# Patient Record
Sex: Female | Born: 1970 | Race: White | Hispanic: No | Smoking: Former smoker
Health system: Southern US, Community
[De-identification: ages and names within clinical notes are randomized; demographics above are authoritative.]

## PROBLEM LIST (undated history)

## (undated) DIAGNOSIS — R079 Chest pain, unspecified: Secondary | ICD-10-CM

## (undated) DIAGNOSIS — R112 Nausea with vomiting, unspecified: Secondary | ICD-10-CM

## (undated) DIAGNOSIS — M5412 Radiculopathy, cervical region: Secondary | ICD-10-CM

## (undated) DIAGNOSIS — T4145XA Adverse effect of unspecified anesthetic, initial encounter: Secondary | ICD-10-CM

## (undated) DIAGNOSIS — T8859XA Other complications of anesthesia, initial encounter: Secondary | ICD-10-CM

## (undated) DIAGNOSIS — I639 Cerebral infarction, unspecified: Secondary | ICD-10-CM

## (undated) DIAGNOSIS — F329 Major depressive disorder, single episode, unspecified: Secondary | ICD-10-CM

## (undated) DIAGNOSIS — I10 Essential (primary) hypertension: Secondary | ICD-10-CM

## (undated) DIAGNOSIS — F419 Anxiety disorder, unspecified: Secondary | ICD-10-CM

## (undated) DIAGNOSIS — F32A Depression, unspecified: Secondary | ICD-10-CM

## (undated) DIAGNOSIS — Z9889 Other specified postprocedural states: Secondary | ICD-10-CM

## (undated) DIAGNOSIS — G43909 Migraine, unspecified, not intractable, without status migrainosus: Secondary | ICD-10-CM

## (undated) DIAGNOSIS — K219 Gastro-esophageal reflux disease without esophagitis: Secondary | ICD-10-CM

## (undated) DIAGNOSIS — M797 Fibromyalgia: Secondary | ICD-10-CM

## (undated) DIAGNOSIS — K8020 Calculus of gallbladder without cholecystitis without obstruction: Secondary | ICD-10-CM

## (undated) DIAGNOSIS — D649 Anemia, unspecified: Secondary | ICD-10-CM

## (undated) HISTORY — DX: Radiculopathy, cervical region: M54.12

## (undated) HISTORY — DX: Cerebral infarction, unspecified: I63.9

## (undated) HISTORY — DX: Essential (primary) hypertension: I10

## (undated) HISTORY — PX: CLEFT PALATE REPAIR: SUR1165

## (undated) HISTORY — PX: CLEFT LIP REPAIR: SUR1164

---

## 1996-11-13 HISTORY — PX: TUBAL LIGATION: SHX77

## 1998-09-29 ENCOUNTER — Encounter: Payer: Self-pay | Admitting: Emergency Medicine

## 1998-09-29 ENCOUNTER — Emergency Department (HOSPITAL_COMMUNITY): Admission: EM | Admit: 1998-09-29 | Discharge: 1998-09-29 | Payer: Self-pay | Admitting: Emergency Medicine

## 1998-09-30 ENCOUNTER — Emergency Department (HOSPITAL_COMMUNITY): Admission: EM | Admit: 1998-09-30 | Discharge: 1998-09-30 | Payer: Self-pay | Admitting: Emergency Medicine

## 2003-05-19 ENCOUNTER — Other Ambulatory Visit: Admission: RE | Admit: 2003-05-19 | Discharge: 2003-05-19 | Payer: Self-pay | Admitting: Obstetrics and Gynecology

## 2003-07-21 ENCOUNTER — Emergency Department (HOSPITAL_COMMUNITY): Admission: EM | Admit: 2003-07-21 | Discharge: 2003-07-22 | Payer: Self-pay | Admitting: Emergency Medicine

## 2004-12-20 ENCOUNTER — Emergency Department: Payer: Self-pay | Admitting: Emergency Medicine

## 2005-11-13 DIAGNOSIS — I639 Cerebral infarction, unspecified: Secondary | ICD-10-CM

## 2005-11-13 HISTORY — DX: Cerebral infarction, unspecified: I63.9

## 2005-11-17 ENCOUNTER — Encounter: Admission: RE | Admit: 2005-11-17 | Discharge: 2005-11-17 | Payer: Self-pay | Admitting: Gastroenterology

## 2006-06-04 ENCOUNTER — Emergency Department (HOSPITAL_COMMUNITY): Admission: EM | Admit: 2006-06-04 | Discharge: 2006-06-04 | Payer: Self-pay | Admitting: Emergency Medicine

## 2006-06-06 ENCOUNTER — Observation Stay (HOSPITAL_COMMUNITY): Admission: EM | Admit: 2006-06-06 | Discharge: 2006-06-08 | Payer: Self-pay | Admitting: *Deleted

## 2006-06-06 ENCOUNTER — Ambulatory Visit: Payer: Self-pay | Admitting: Family Medicine

## 2006-06-07 ENCOUNTER — Encounter (INDEPENDENT_AMBULATORY_CARE_PROVIDER_SITE_OTHER): Payer: Self-pay | Admitting: *Deleted

## 2006-06-11 ENCOUNTER — Other Ambulatory Visit (HOSPITAL_COMMUNITY): Admission: RE | Admit: 2006-06-11 | Discharge: 2006-09-09 | Payer: Self-pay | Admitting: Psychiatry

## 2006-06-12 ENCOUNTER — Ambulatory Visit: Payer: Self-pay | Admitting: Psychiatry

## 2006-06-14 ENCOUNTER — Encounter: Admission: RE | Admit: 2006-06-14 | Discharge: 2006-09-12 | Payer: Self-pay | Admitting: Family Medicine

## 2006-06-18 ENCOUNTER — Ambulatory Visit: Payer: Self-pay | Admitting: Sports Medicine

## 2006-06-26 ENCOUNTER — Ambulatory Visit: Payer: Self-pay | Admitting: Sports Medicine

## 2006-06-29 ENCOUNTER — Ambulatory Visit: Payer: Self-pay | Admitting: Internal Medicine

## 2007-01-10 DIAGNOSIS — G43109 Migraine with aura, not intractable, without status migrainosus: Secondary | ICD-10-CM | POA: Insufficient documentation

## 2007-01-10 DIAGNOSIS — G43909 Migraine, unspecified, not intractable, without status migrainosus: Secondary | ICD-10-CM

## 2007-06-04 ENCOUNTER — Other Ambulatory Visit: Admission: RE | Admit: 2007-06-04 | Discharge: 2007-06-04 | Payer: Self-pay | Admitting: Family Medicine

## 2008-03-08 ENCOUNTER — Emergency Department: Payer: Self-pay | Admitting: Emergency Medicine

## 2008-08-01 ENCOUNTER — Emergency Department: Payer: Self-pay | Admitting: Unknown Physician Specialty

## 2008-10-02 ENCOUNTER — Other Ambulatory Visit: Admission: RE | Admit: 2008-10-02 | Discharge: 2008-10-02 | Payer: Self-pay | Admitting: Family Medicine

## 2008-10-05 ENCOUNTER — Encounter: Admission: RE | Admit: 2008-10-05 | Discharge: 2008-10-05 | Payer: Self-pay | Admitting: Family Medicine

## 2008-11-13 HISTORY — PX: ROTATOR CUFF REPAIR: SHX139

## 2009-08-22 ENCOUNTER — Encounter: Admission: RE | Admit: 2009-08-22 | Discharge: 2009-08-22 | Payer: Self-pay | Admitting: Neurology

## 2011-03-31 NOTE — Discharge Summary (Signed)
[name], [name]                ACCOUNT NO.:  [id number]   MEDICAL RECORD NO.:  [id number]          PATIENT TYPE:  OBV   LOCATION:  3015                         FACILITY:  [facility]   PHYSICIAN:  Leighton Roach McDiarmid, M.D.DATE OF BIRTH:  [birth year]   DATE OF ADMISSION:  DATE OF DISCHARGE:  06/08/2006                                 DISCHARGE SUMMARY   DISCHARGE DIAGNOSES:  1.  Conversion disorder.  2.  Migraine headaches.  3.  Depression.  4.  Anxiety.   DISCHARGE MEDICATIONS:  1.  Effexor XR 37.5 mg daily x7 days, then Effexor XR 75 mg daily.  2.  Percocet 325/5 one to two tabs q.4-6 hours p.r.n. headache.   DISCHARGE INSTRUCTIONS:  The patient was to followup at [facility]  for an intake appointment at 2 p.m. on June 09, 2006, and the patient was to  begin incentive outpatient program at [facility] on Monday, June 11, 2006.  The patient is to followup with her headache doctor, Dr. Neale Burly, as  previously scheduled, and the patient is to followup with Dr. Luz Brazen at  [facility] on June 18, 2006 at 3:30 p.m.   PROCEDURES:  1.  MRI of brain.  2.  Carotid Dopplers.  3.  Two D echocardiogram.   CONSULTS:  1.  Neurology.  2.  Physical therapy.  3.  Occupational therapy.  4.  Speech therapy.   HOSPITAL COURSE BY PROBLEMS:  1.  Conversion disorder:  Ms. [name] is a 40 year old black female who      presented to the [facility] emergency department on July 22nd with      numbness and left facial droop accompanied by a severe headache.  The      patient also complained of difficulty speaking.  At that time, she left      [facility] against medical advice secondary to claustrophobia      and fear of the MRI machine.  Her headache was treated at the [facility] ED, and after her headache lessened, per the patient and her      mother, her symptoms resolved completely.  They returned the following      day when her headache  worsened.  She saw her headache doctor on June 06, 2006, the date of admission, who noticed the decreased sensation in her      left face and weakness in her left upper and lower extremities, as well      as her dysarthria, and recommended that she come to the [facility]      emergency department to have an MRI done with stroke workup.  She      presented to the [facility] ED shortly after that complaining of the      same left facial droop, dysarthria and left upper and lower extremity      numbness and weakness.  The patient was started on aspirin 325 mg daily.      A complete stroke workup was done, with all  negative including negative      carotid Dopplers, negative 2D echocardiogram.  A sed rate was within      normal limits to rule out vasculitis.  A fasting lipid panel was within      normal limits.  The patient had an inconsistent exam with varying      degrees of weakness on the left side.  Her speech was slow, but      articulate and coherent.  Neuro was consulted, and determined that her      symptoms were nonorganic and consistent with a conversion disorder.  The      patient was extremely depressed and anxious.  The loss of the patient's      father at patient's current age of heart disease, the loss of her      grandmother and best friend 1 year ago, marital problems and trouble at      work are all current stressors.  The patient is to attend intensive      outpatient therapy at [facility], and the patient was started on      Effexor.  2.  Depression:  The patient is extremely depressed.  She complained of      anhedonia, depressed mood, insomnia and decreased appetite, guilt,      decreased concentration, hopelessness, and tearfulness.  She denied      suicidal ideation and homicidal ideation.  She is referred to [facility] for the intensive outpatient program, and started on Effexor.  3.  Migraines:  The patient sees Dr. Neale Burly for migraines.   Nothing has      every really worked well to control her headaches.  She is discharged      from the hospital on Percocet, which she says has worked the best of      anything, but will need an improved migraine regimen as an outpatient.     ______________________________  Levander Campion, M.D.    ______________________________  Leighton Roach McDiarmid, M.D.    JH/MEDQ  D:  06/11/2006  T:  06/12/2006  Job:  [id number]   cc:   Etta Grandchild, M.D.  Levander Campion, M.D.  Behavioral Health  Dr. Neale Burly  [facility] Clinic

## 2011-03-31 NOTE — H&P (Signed)
[name], [name]                ACCOUNT NO.:  [id number]   MEDICAL RECORD NO.:  [id number]          PATIENT TYPE:  INP   LOCATION:  3015                         FACILITY:  [facility]   PHYSICIAN:  Angeline Slim, M.D.  DATE OF BIRTH:  Apr 10, 1971   DATE OF ADMISSION:  06/06/2006  DATE OF DISCHARGE:                                HISTORY & PHYSICAL   CHIEF COMPLAINT:  Left-sided weakness, facial droop, numbness.   HISTORY OF PRESENT ILLNESS:  A 40 year old white female presented to [facility] Emergency Department on June 03, 2006 with numbness and left facial  droop accompanied by severe headache. She presented here today with the same  symptoms.  The patient has a history of severe migraines treated by the  headache clinic with very little success.  She left [facility] AMA  on Monday secondary to claustrophobia and fear of the MRI machine.  She saw  her headache doctor today who noted the decreased sensation in her left face  and mild weakness in the left upper and lower extremities as well as her  dysarthria and recommended that she come back to the emergency department  and have an MRI done with stroke work-up.  She is still having headaches as  well as dysarthria since Monday.  Her facial droop and weakness/numbness  have improved since Monday but are still present.   REVIEW OF SYSTEMS:  Denies chest pain.  Denies shortness of breath.  Denies  nausea and vomiting, diarrhea, constipation.  Denies rash.  Denies fever.  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  Denies dysuria.   PAST MEDICAL HISTORY:  1.  Migraine.  2.  Depression.  3.  Congenital cleft palate, status post repair.  4.  Sinus problems.   FAMILY HISTORY:  Mother is alive and healthy.  Father died at 28 years of an  MI.  Uncle also died at a young age or an MI.  Grandmothers x2 had CVAs.  They were both older.   SOCIAL HISTORY:  The patient denies alcohol, tobacco  or drug use.  The  patient was lives with her husband and two boys, one 15 years and one grade  school age.  The patient works at __________ [employer] hospital.   MEDICATIONS:  Zoloft which was discontinued after her stroke on Monday by  the patient.   ALLERGIES:  PENICILLIN, ERYTHROMYCIN.   PHYSICAL EXAMINATION:  VITAL SIGNS:  Stable noted.  GENERAL:  In no acute distress.  PSYCHIATRY:  Alert and oriented x3.  Cognition intact.  Flat affect.  Normal  mood.  NEUROLOGIC:  Normal gait.  Left upper extremity 4+/5 strength.  Left lower  extremity 4+/5 strength.  Total left facial droop.  The patient is able to  smile but there is asymmetric look to it.  The patient has dysarthric  speech.  Balance is intact.  Cognition is intact.  She has mild difficulty  with the right-sided heel to the left-sided shin test but normal with left-  sided heel to right shin test.  The patient passed  the bedside swallow  evaluation.  PULMONARY:  Clear to auscultation bilaterally.  CARDIOVASCULAR:  Regular rate and rhythm.  No murmurs, rubs, or gallops.  2+  dorsalis pedis. No carotid bruits.  ABDOMEN:  Soft, nontender.  Normoactive bowel sounds.  EXTREMITIES:  No edema.  SKIN: No rash.  BACK:  Nontender.   LABORATORY DATA:  Hemoglobin 13.9, hematocrit 41.1, platelets 234, white  count 5.3.  ANC 2.9.  CK 48, MB 0.6, troponin 0.02.  Urinalysis normal.  INR  0.9, PTT 32.  BNP less than 30.  ISTAT normal.  Head MRI showed possible  small aneurysms, acute infarct.  EKG showed sinus bradycardia but otherwise  normal.   ASSESSMENT/PLAN:  A 40 year old white female with acute infarct causing left-  sided facial droop and dysarthria with left-sided weakness.  1.  Stroke:  Will start aspirin 325 mg p.o. daily.  Will keep her on      telemetry overnight to rule out paroxysmal atrial fibrillation although      EKG looks good today.  Will check a 2-D echo.  Will consider TEE if this      is negative.  Patent  foramen ovale is a possibility but no evidence of      deep venous thrombosis on exam.  We will get sed rate and CRP to help      rule out vasculitis or other inflammatory causes.  We will check ANA for      possible lupus.  Will check HIV and urine drug screen although the      patient seems to be at low risk given her social history.  She will need      attempted PT/OT, speech therapy.  We will ask their opinion on inpatient      versus outpatient rehab as well.  We will also check homocystine and      hypercoagulability panel.  We will also speak to the librarian about      getting access to review articles __________ that cover the subject of      young people with strokes and could give further insight into further      work-up.  Also check fasting lipid panel and carotid Dopplers.   1.  Migraines:  The patient will get Percocet p.r.n. here in the hospital.   1.  Disability:  We will get Social work consult to help research this and      answer the patient's questions regarding short-term disability.  She      will follow up with me, Angeline Slim, MD, at the [facility].      Angeline Slim, M.D.     AL/MEDQ  D:  06/07/2006  T:  06/07/2006  Job:  [id number]

## 2011-03-31 NOTE — Consult Note (Signed)
NAMEEmalynn, [name]                ACCOUNT NO.:  [id number]   MEDICAL RECORD NO.:  [id number]          PATIENT TYPE:  EMS   LOCATION:  ED                           FACILITY:  [facility]   PHYSICIAN:  Pramod P. Pearlean Brownie, MD    DATE OF BIRTH:  04-20-1971   DATE OF CONSULTATION:  DATE OF DISCHARGE:                                   CONSULTATION   REASON FOR CONSULT:  Code stroke.   HISTORY OF PRESENT ILLNESS:  [name] is a 40 year old Caucasian lady  who started having a headache last night.  The headache got worse today and  she has had constant severe headache involving her vertex with some nausea  and photophobia.  At about 1:30 today she noticed sudden onset of some left  facial weakness and droop, as well as some numbness.  She also had some  blurred vision for a few minutes which resolved quickly.  On arrival at  [facility] emergency room she was noted as having some facial weakness and  subjective numbness, and hence code stroke was called.  CT scan of the head  was unremarkable.  Patient still has a persistent headache.  She has taken a  __________ tablet last night as well as today with only minimum relief.  She  has a long history of migraine headaches for which she sees Dr. Neale Burly at  the [facility].  She was placed on Topamax 75 mg a day for  headache prophylaxis, but she continued to have headaches, and hence she  stopped it a few months ago.  She has noted increasing headache frequency to  once per week now.  Over-the-counter medications do not seem to help much.  She has also tried Flexeril which does not help.  She has had some blurred  vision with some of her previous headaches, but denies any facial weakness  or speech difficulties.  She denies any double vision at the present time.  She has no known history of stroke, TIA, seizures or other neurological  problems.  She does not have any significant vascular risk factors in the  form of smoking,  hypertension, diabetes, hyperlipidemia or heart disease.   HOME MEDICATIONS:  Not listed.   MEDICATION ALLERGIES:  1.  PENICILLIN.  2.  AMOXICILLIN.  3.  ERYTHROMYCIN.   SOCIAL HISTORY:  The patient is married, lives with her husband.  She quit  smoking more than 10 years ago.  She does not drink alcohol, denies doing  drugs.  She has two boys.   REVIEW OF SYSTEMS:  Positive for headache, blurred vision, numbness, and  weakness.   PHYSICAL EXAMINATION:  Young Caucasian lady who is not in distress, and  afebrile.  Pulse rate is 87 per minute and regular.  Respiratory rate 20 per  minute.  Blood pressure 128/85.  Sats 97% on room air.  HEAD:  Nontraumatic.  Neck is supple without bruits.  ENT exam unremarkable.  CARDIAC EXAM:  No murmur or gallop.  LUNGS:  Clear to auscultation.  ABDOMEN:  Soft and nontender.  NEUROLOGICAL EXAM:  Patient is pleasant, awake, alert, cooperative with no  aphasia, apraxia or dysarthria.  Eyes movements are full range without  nystagmus.  She has some hypotropia of the left eye.  There is some  restriction of up-gaze, but there is no objective diplopia when she does so.  There is mild facial asymmetry with weakness of the right nasolabial fold  when she smiles.  She has good strength on the forehead muscles and eye  closure.  She is unable to whistle and blow her cheeks, but I doubt her  cooperation.  She appears anxious.  Palatal movements normal, tongue is  midline.  Motor system exam reveals no upper extremity drift, symmetric  strength, tone, reflexes, coordination and sensation.  Gait was not tested.  Patient complains of subjective numbness in the left side of the face and  body, but she splits the midline on her forehead as well as face.   DATA REVIEW:  Comprehensive metabolic panel is normal.  Urine drug screen is  negative.  UA is normal.  WBC count is normal.  CT of the head, noncontrast  study, is normal.   IMPRESSION:  A 40 year old lady  with sudden onset of headache with left  sided body paresthesia, then right lower facial weakness.  Neurological exam  is not commensurate with a stroke in a single vascular distribution.  Perhaps this may represent complicated migraine with some underlying anxiety  with embellishment of her symptoms.  She does not have typical vascular risk  factors.   PLAN:  I would recommend treatment of the head with Reglan 20 mg with  Toradol 30 mg IV.  If this is not effective, may use Dilaudid.  I will  advise her to go back on her Topamax 75 mg a day for migraine prevention.  MRI scan of the brain.  If it is negative patient can be discharged home on  Topamax and advised to follow up with her neurologist, Dr. Neale Burly, in the  future as needed.  If the MRI does show a stroke, we will admit her for  further workup for stroke in the young.  I will be happy to follow her in  the future if needed.  Kindly call for questions.           ______________________________  Sunny Schlein. Pearlean Brownie, MD     PPS/MEDQ  D:  06/04/2006  T:  06/05/2006  Job:  [id number]

## 2011-03-31 NOTE — Consult Note (Signed)
NAMECHRISTIANNE, [name]                ACCOUNT NO.:  [id number]   MEDICAL RECORD NO.:  [id number]          PATIENT TYPE:  INP   LOCATION:  3015                         FACILITY:  [facility]   PHYSICIAN:  Genene Churn. Love, M.D.    DATE OF BIRTH:  11-29-1970   DATE OF CONSULTATION:  06/07/2006  DATE OF DISCHARGE:                                   CONSULTATION   NEUROLOGICAL CONSULTATION.   PATIENT'S ADDRESS:  [address], [place].   This is a 40 year old right-handed white married female is seen for  evaluation of possible stroke.   HISTORY OF PRESENT ILLNESS:  [name] has no known history of high  blood pressure, diabetes mellitus, heart disease, cigarette use, drug use,  or current hormone therapy.  She has a long history of migraine headaches  beginning in childhood associated with car sickness as a child and positive  family history of migraine headaches in that her mother and maternal aunts  have all had migraine headaches.  She has had a headache since Saturday June 02, 2006 and on June 04, 2006 noticed difficulty with her facial drawing.  She had delayed speech and slurred speech.  She was seen by Dr. Onnie Boer  physician associate June 06, 2006 and referred to [facility].  There she was noted to have slow slurred speech, blurred vision and left-  sided weakness and was admitted.  She has been on Zoloft and recently  increased to 100 mg per day for headaches.  She has also been on Flexeril  for her headaches as an abortive therapy ,which has been successful and has  been followed by Dr. Santiago Glad, who began Topamax in October 2006,  which has not been very effective for headaches.  She states that she has a  history of endometriosis in the past.  She has had cleft lip surgery, tubal  ligation and admitted to the hospital for birth.  SHE HAS A HISTORY OF  ALLERGIES TO ERYTHROMYCIN, PENICILLIN AND AMOXICILLIN.  She was educated  through 12+ grades.  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   EXAMINATION:  GENERAL:  Revealed a well-developed white female with a blood  pressure in the right and left arm of 120/80 and 110/80.  Heart rate was 80  and regular.  NECK:  There were no carotid or supraclavicular bruits heard.  The tympanic  membranes were clear.  The neck flexion/extension maneuvers were  unremarkable.  MENTAL STATUS:  She was alert and oriented x3, followed three-step commands.  Her cranial nerve examination revealed that she used childlike speech.  There was no evidence of an aphasia.  Cranial nerve examination visual  fields full.  Both disks were seen and flat.  The extraocular movements were  full and corneals were present.  The facial sensation was equal.  There was  no facial motor asymmetry.  Hearing was present with air conduction greater  than bone conduction.  Tongue was midline.  The uvula was midline and gags  were present.  There was an asymmetry to her face with a slight left ptosis  and an increase in the left corner of the mouth versus right possibly  related to her old cleft lip surgery.  Her motor examination with give-way  phenomenon in the left hand and arm more so than the right, but it was  present bilaterally.  She also gave-way with strength testing in her left  foot and leg and had altered sensation, a two-point discrimination in her  left hand.  Deep tendon reflexes were 1-2+.  Plantar responses were  downgoing.   LABORATORY DATA:  A CT scan of the brain on June 04, 2006 was normal.  MRI  study of the brain June 06, 2006 showed no acute infarct.  She had minimal  nonspecific white matter changes.  Intracranial MRA was unremarkable.  Her  laboratory data revealed a pH of 7378,  PCO2 43.1, bicarb 25.4, PO2 is 27,  white blood cell count 4700, hemoglobin was 12.2, hematocrit 36.5, platelet  count 201,000.  Her sed rate was 1.  Pro time  was 12.2 with an INR of 0.9  and PTT of 32.  Sodium is 139, potassium 3.7, chloride 107, CO2 content was  27, glucose 87, BUN 11.  Cholesterol 189, triglycerides 108, HDL 45, LDLs  high at 122.  Calcium was 9.  Drug screen was negative.  Pregnancy test was  negative.   IMPRESSION:  1.  Migraine headaches, 346.10.  2.  Doubt acute stroke.  3.  Non-organic examination.  4.  Depression, 311.   At this time, I agree with workup.  I do not think this person has had an  acute stroke.  This may be migraine TIA with some embellishment and  possible underlying depression.  Plan at this time is to review her studies.           ______________________________  Genene Churn. Sandria Manly, M.D.     JML/MEDQ  D:  06/07/2006  T:  06/07/2006  Job:  [id number]   cc:   Santiago Glad  Fax: [phone]

## 2011-09-11 ENCOUNTER — Other Ambulatory Visit (HOSPITAL_COMMUNITY)
Admission: RE | Admit: 2011-09-11 | Discharge: 2011-09-11 | Disposition: A | Discharge status: Home / Self Care | Payer: Self-pay | Source: Ambulatory Visit | Attending: Family Medicine | Admitting: Family Medicine

## 2011-09-11 ENCOUNTER — Other Ambulatory Visit: Payer: Self-pay | Admitting: Physician Assistant

## 2011-09-11 DIAGNOSIS — Z01419 Encounter for gynecological examination (general) (routine) without abnormal findings: Secondary | ICD-10-CM | POA: Insufficient documentation

## 2011-09-13 ENCOUNTER — Other Ambulatory Visit (HOSPITAL_COMMUNITY): Payer: Self-pay | Admitting: Family Medicine

## 2011-09-13 DIAGNOSIS — Z1231 Encounter for screening mammogram for malignant neoplasm of breast: Secondary | ICD-10-CM

## 2011-12-04 ENCOUNTER — Ambulatory Visit: Payer: Self-pay

## 2011-12-26 ENCOUNTER — Other Ambulatory Visit: Payer: Self-pay | Admitting: Family Medicine

## 2011-12-26 DIAGNOSIS — Z1231 Encounter for screening mammogram for malignant neoplasm of breast: Secondary | ICD-10-CM

## 2012-01-01 ENCOUNTER — Ambulatory Visit: Payer: Self-pay

## 2012-12-11 ENCOUNTER — Emergency Department (HOSPITAL_COMMUNITY): Payer: Self-pay

## 2012-12-11 ENCOUNTER — Observation Stay (HOSPITAL_COMMUNITY): Payer: Self-pay

## 2012-12-11 ENCOUNTER — Observation Stay (HOSPITAL_COMMUNITY)
Admission: EM | Admit: 2012-12-11 | Discharge: 2012-12-12 | Disposition: A | Discharge status: Home / Self Care | Payer: 59 | Attending: Cardiology | Admitting: Cardiology

## 2012-12-11 ENCOUNTER — Encounter (HOSPITAL_COMMUNITY): Payer: Self-pay

## 2012-12-11 DIAGNOSIS — R079 Chest pain, unspecified: Secondary | ICD-10-CM

## 2012-12-11 DIAGNOSIS — F17200 Nicotine dependence, unspecified, uncomplicated: Secondary | ICD-10-CM | POA: Insufficient documentation

## 2012-12-11 DIAGNOSIS — Z720 Tobacco use: Secondary | ICD-10-CM | POA: Diagnosis present

## 2012-12-11 DIAGNOSIS — F411 Generalized anxiety disorder: Secondary | ICD-10-CM | POA: Insufficient documentation

## 2012-12-11 DIAGNOSIS — Z23 Encounter for immunization: Secondary | ICD-10-CM | POA: Insufficient documentation

## 2012-12-11 DIAGNOSIS — K8020 Calculus of gallbladder without cholecystitis without obstruction: Secondary | ICD-10-CM | POA: Insufficient documentation

## 2012-12-11 DIAGNOSIS — F329 Major depressive disorder, single episode, unspecified: Secondary | ICD-10-CM | POA: Insufficient documentation

## 2012-12-11 DIAGNOSIS — F3289 Other specified depressive episodes: Secondary | ICD-10-CM | POA: Insufficient documentation

## 2012-12-11 DIAGNOSIS — Z8249 Family history of ischemic heart disease and other diseases of the circulatory system: Secondary | ICD-10-CM | POA: Insufficient documentation

## 2012-12-11 DIAGNOSIS — R0789 Other chest pain: Secondary | ICD-10-CM | POA: Insufficient documentation

## 2012-12-11 DIAGNOSIS — R42 Dizziness and giddiness: Secondary | ICD-10-CM | POA: Insufficient documentation

## 2012-12-11 DIAGNOSIS — R209 Unspecified disturbances of skin sensation: Secondary | ICD-10-CM | POA: Insufficient documentation

## 2012-12-11 DIAGNOSIS — R0602 Shortness of breath: Secondary | ICD-10-CM | POA: Insufficient documentation

## 2012-12-11 DIAGNOSIS — R1013 Epigastric pain: Principal | ICD-10-CM | POA: Insufficient documentation

## 2012-12-11 DIAGNOSIS — G43909 Migraine, unspecified, not intractable, without status migrainosus: Secondary | ICD-10-CM | POA: Insufficient documentation

## 2012-12-11 HISTORY — DX: Nausea with vomiting, unspecified: Z98.890

## 2012-12-11 HISTORY — DX: Nausea with vomiting, unspecified: R11.2

## 2012-12-11 HISTORY — DX: Adverse effect of unspecified anesthetic, initial encounter: T41.45XA

## 2012-12-11 HISTORY — DX: Migraine, unspecified, not intractable, without status migrainosus: G43.909

## 2012-12-11 HISTORY — DX: Other complications of anesthesia, initial encounter: T88.59XA

## 2012-12-11 HISTORY — DX: Calculus of gallbladder without cholecystitis without obstruction: K80.20

## 2012-12-11 HISTORY — DX: Anxiety disorder, unspecified: F41.9

## 2012-12-11 HISTORY — DX: Chest pain, unspecified: R07.9

## 2012-12-11 HISTORY — DX: Gastro-esophageal reflux disease without esophagitis: K21.9

## 2012-12-11 HISTORY — DX: Depression, unspecified: F32.A

## 2012-12-11 HISTORY — DX: Major depressive disorder, single episode, unspecified: F32.9

## 2012-12-11 LAB — CREATININE, SERUM
GFR calc Af Amer: >90 mL/min (ref >90)
GFR calc non Af Amer: >90 mL/min (ref >90)

## 2012-12-11 LAB — CBC
HCT: 38.2 % (ref 36.0–46.0)
HCT: 36.8 % (ref 36.0–46.0)
MCH: 29.2 pg (ref 26.0–34.0)
MCHC: 33.5 g/dL (ref 30.0–36.0)
MCV: 88 fL (ref 78.0–100.0)
Platelets: 201 10*3/uL (ref 150–400)
RBC: 4.38 MIL/uL (ref 3.87–5.11)
RBC: 4.18 MIL/uL (ref 3.87–5.11)
WBC: 6 10*3/uL (ref 4.0–10.5)
WBC: 5.7 10*3/uL (ref 4.0–10.5)

## 2012-12-11 LAB — LIPASE, BLOOD: Lipase: 27 U/L (ref 11–59)

## 2012-12-11 LAB — POCT I-STAT TROPONIN I: Troponin i, poc: 0 ng/mL (ref 0.00–0.08)

## 2012-12-11 LAB — COMPREHENSIVE METABOLIC PANEL
ALT: 9 U/L (ref 0–35)
Albumin: 4 g/dL (ref 3.5–5.2)
Chloride: 105 mEq/L (ref 96–112)
Creatinine, Ser: 0.53 mg/dL (ref 0.50–1.10)
Glucose, Bld: 88 mg/dL (ref 70–99)
Total Protein: 6.7 g/dL (ref 6.0–8.3)

## 2012-12-11 LAB — PROTIME-INR: INR: 0.98 (ref 0.00–1.49)

## 2012-12-11 MED ORDER — DIPHENHYDRAMINE HCL 25 MG PO CAPS: 50.0000 mg | ORAL_CAPSULE | Freq: Once | ORAL | Status: DC

## 2012-12-11 MED ORDER — ALPRAZOLAM 0.25 MG PO TABS
0.2500 mg | ORAL_TABLET | Freq: Two times a day (BID) | ORAL | Status: DC | PRN
  Administered 2012-12-11 – 2012-12-12 (×2): 0.25 mg via ORAL
  Filled 2012-12-11 (×2): qty 1

## 2012-12-11 MED ORDER — ENOXAPARIN SODIUM 40 MG/0.4ML ~~LOC~~ SOLN
40.0000 mg | SUBCUTANEOUS | Status: DC
  Administered 2012-12-11: 40 mg via SUBCUTANEOUS
  Filled 2012-12-11 (×2): qty 0.4

## 2012-12-11 MED ORDER — ACETAMINOPHEN 325 MG PO TABS
650.0000 mg | ORAL_TABLET | ORAL | Status: DC | PRN
  Administered 2012-12-12 (×2): 650 mg via ORAL
  Filled 2012-12-11 (×2): qty 2

## 2012-12-11 MED ORDER — ASPIRIN 81 MG PO CHEW
324.0000 mg | CHEWABLE_TABLET | Freq: Once | ORAL | Status: AC
  Administered 2012-12-11: 324 mg via ORAL
  Filled 2012-12-11: qty 4

## 2012-12-11 MED ORDER — PNEUMOCOCCAL VAC POLYVALENT 25 MCG/0.5ML IJ INJ
0.5000 mL | INJECTION | INTRAMUSCULAR | Status: AC
  Administered 2012-12-12: 0.5 mL via INTRAMUSCULAR
  Filled 2012-12-11: qty 0.5

## 2012-12-11 MED ORDER — ONDANSETRON HCL 4 MG/2ML IJ SOLN
4.0000 mg | Freq: Once | INTRAMUSCULAR | Status: AC
  Administered 2012-12-11: 4 mg via INTRAVENOUS
  Filled 2012-12-11: qty 2

## 2012-12-11 MED ORDER — METOPROLOL TARTRATE 25 MG PO TABS
25.0000 mg | ORAL_TABLET | Freq: Two times a day (BID) | ORAL | Status: DC
  Administered 2012-12-11 – 2012-12-12 (×2): 25 mg via ORAL
  Filled 2012-12-11 (×3): qty 1

## 2012-12-11 MED ORDER — SODIUM CHLORIDE 0.9 % IV SOLN: 250.0000 mL | INTRAVENOUS | Status: DC | PRN

## 2012-12-11 MED ORDER — PREDNISONE 20 MG PO TABS: 50.0000 mg | ORAL_TABLET | Freq: Four times a day (QID) | ORAL | Status: DC

## 2012-12-11 MED ORDER — INFLUENZA VIRUS VACC SPLIT PF IM SUSP
0.5000 mL | INTRAMUSCULAR | Status: DC
  Filled 2012-12-11: qty 0.5

## 2012-12-11 MED ORDER — SODIUM CHLORIDE 0.9 % IJ SOLN: 3.0000 mL | INTRAMUSCULAR | Status: DC | PRN

## 2012-12-11 MED ORDER — NITROGLYCERIN 0.4 MG SL SUBL
0.4000 mg | SUBLINGUAL_TABLET | SUBLINGUAL | Status: DC | PRN
  Administered 2012-12-11: 0.4 mg via SUBLINGUAL
  Filled 2012-12-11: qty 25

## 2012-12-11 MED ORDER — PANTOPRAZOLE SODIUM 40 MG PO TBEC
40.0000 mg | DELAYED_RELEASE_TABLET | Freq: Every day | ORAL | Status: DC
  Administered 2012-12-12: 40 mg via ORAL
  Filled 2012-12-11: qty 1

## 2012-12-11 MED ORDER — SODIUM CHLORIDE 0.9 % IJ SOLN
3.0000 mL | Freq: Two times a day (BID) | INTRAMUSCULAR | Status: DC
  Administered 2012-12-11 – 2012-12-12 (×2): 3 mL via INTRAVENOUS

## 2012-12-11 MED ORDER — ONDANSETRON HCL 4 MG/2ML IJ SOLN: 4.0000 mg | Freq: Four times a day (QID) | INTRAMUSCULAR | Status: DC | PRN

## 2012-12-11 MED ORDER — ZOLPIDEM TARTRATE 5 MG PO TABS: 5.0000 mg | ORAL_TABLET | Freq: Every evening | ORAL | Status: DC | PRN

## 2012-12-11 MED ORDER — NITROGLYCERIN 0.4 MG SL SUBL: 0.4000 mg | SUBLINGUAL_TABLET | SUBLINGUAL | Status: DC | PRN

## 2012-12-11 MED ORDER — SODIUM CHLORIDE 0.9 % IV SOLN
1000.0000 mL | INTRAVENOUS | Status: DC
  Administered 2012-12-11: 1000 mL via INTRAVENOUS

## 2012-12-11 MED ORDER — ASPIRIN EC 81 MG PO TBEC
81.0000 mg | DELAYED_RELEASE_TABLET | Freq: Every day | ORAL | Status: DC
  Administered 2012-12-12: 81 mg via ORAL
  Filled 2012-12-11: qty 1

## 2012-12-11 NOTE — ED Notes (Signed)
Pt returned from ultrasound

## 2012-12-11 NOTE — ED Notes (Signed)
Pt walked to bathroom and back with no issues. Pt reports when she got back she started to have some CP, lasted approx 4 mins and is now gone. Pt reports her left arm tingling came back a few times.

## 2012-12-11 NOTE — ED Notes (Signed)
Pt reports she wants to leave. Pt told if she did she would have to leave AMA. Pt explained benefits of staying. Pt agreed to stay a little be longer.

## 2012-12-11 NOTE — ED Notes (Signed)
Pt states she has been having epigastric pain off and on for about a week now and nauseated. Denies any vomiting or radiation of the pain.

## 2012-12-11 NOTE — ED Notes (Signed)
Cardiologist PA at bedside 

## 2012-12-11 NOTE — ED Notes (Signed)
Pt back from radiology. Pt ambulated to restroom with no issues.

## 2012-12-11 NOTE — ED Notes (Signed)
Pt denies CP and left arm tingling.

## 2012-12-11 NOTE — ED Provider Notes (Signed)
History    CSN: [id number] Arrival date & time 12/11/12  1106 First MD Initiated Contact with Patient 12/11/12 1123      Chief Complaint  Patient presents with  . Abdominal Pain/chest pain    The history is provided by the patient.  Pt started having pain in her upper abdomen and lower chest.  That started about a week ago.  The episodes come and go.  They last minutes at a time.  The pain is pinching and pressure.  She feels it radiate to her back area and gets numbness in her left arm.  Associated with the symptoms have been a lot of fatigue and some nausea.  She also has had some chills. No change with eating but she does feel a little worse when she is walking and better when she is resting. No history of heart or lung problems although she has not seen an MD in a while.  History reviewed. No pertinent past medical history.  Past Surgical History  Procedure Date  . Rotator cuff repair     left  . Tubal ligation   . Cleft lip repair   . Cleft palate repair   No abdominal surgery.  Famhx:  Father with early heart disease in his 75s, deceased  History  Substance Use Topics  . Smoking status: Current Every Day Smoker  . Smokeless tobacco: Not on file  . Alcohol Use: No    OB History    Grav Para Term Preterm Abortions TAB SAB Ect Mult Living                  Review of Systems  All other systems reviewed and are negative.    Allergies  Amoxicillin; Iohexol; and Erythromycin  Home Medications  No current outpatient prescriptions on file.  BP 126/57  Pulse 89  Temp 97.9 F (36.6 C) (Oral)  Resp 20  Ht 5\' 1"  (1.549 m)  Wt 140 lb (63.504 kg)  BMI 26.45 kg/m2  SpO2 100%  LMP 10/30/2012  Physical Exam  Nursing note and vitals reviewed. Constitutional: She appears well-developed and well-nourished. No distress.  HENT:  Head: Normocephalic and atraumatic.  Right Ear: External ear normal.  Left Ear: External ear normal.  Eyes: Conjunctivae normal are  normal. Right eye exhibits no discharge. Left eye exhibits no discharge. No scleral icterus.  Neck: Neck supple. No tracheal deviation present.  Cardiovascular: Normal rate, regular rhythm and intact distal pulses.   Pulmonary/Chest: Effort normal and breath sounds normal. No stridor. No respiratory distress. She has no wheezes. She has no rales.  Abdominal: Soft. Bowel sounds are normal. She exhibits no distension, no abdominal bruit and no pulsatile midline mass. There is tenderness in the epigastric area. There is no rebound and no guarding. No hernia.       Very mild in epigastrum  Musculoskeletal: She exhibits no edema and no tenderness.  Neurological: She is alert. She has normal strength. No sensory deficit. Cranial nerve deficit:  no gross defecits noted. She exhibits normal muscle tone. She displays no seizure activity. Coordination normal.  Skin: Skin is warm and dry. No rash noted.  Psychiatric: She has a normal mood and affect.    ED Course  Procedures (including critical care time) EKG  Rate: 85  Rhythm: normal sinus rhythm  QRS Axis: normal  Intervals: normal  ST/T Wave abnormalities: normal  Conduction Disutrbances:none  Narrative Interpretation:   Old EKG Reviewed: none available 2:13 PM patient states her pain  is resolved now. She feels better after getting nitroglycerin.   Labs Reviewed  CBC  COMPREHENSIVE METABOLIC PANEL  PROTIME-INR  APTT  LIPASE, BLOOD  POCT I-STAT TROPONIN I  POCT I-STAT TROPONIN I   Dg Chest 2 View  12/11/2012  *RADIOLOGY REPORT*  Clinical Data: Chest pain.  CHEST - 2 VIEW  Comparison: None.  Findings: Heart and mediastinal contours are within normal limits. No focal opacities or effusions.  No acute bony abnormality.  IMPRESSION: No active cardiopulmonary disease.   Original Report Authenticated By: Charlett Nose, M.D.     MDM  Patient presents emergent complaints of chest pain. She has a very strong family history and she does smoke  cigarettes. Concern that her chest pain could be associated with coronary artery disease. I will consult with the cardiologist regarding admission and stress testing versus serial enzymes here in the emergency department and close followup.        Celene Kras, MD 12/11/12 [phone]

## 2012-12-11 NOTE — ED Notes (Signed)
Attempted to give report. RN unavailable at the moment.

## 2012-12-11 NOTE — ED Notes (Signed)
Also reports pain behind her left shoulder blade

## 2012-12-11 NOTE — Consult Note (Signed)
CARDIOLOGY CONSULT NOTE   Patient ID: [name] MRN: [id number] DOB/AGE: Apr 12, 1971 42 y.o.  Admit date: 12/11/2012  Primary Physician   User Centricity, MD Primary Cardiologist   None  Reason for Consultation   Chest Pain  HPI: 42 year old female with no past history of CAD but is a current smoker presents to the ED with chest pain. She has had some chest pain on and off for the last year, but she states these episodes are isolated and occur once every month or two.   She has had intermittent chest pain for the past 3-4 days. It is a substernal pinching and pressure sensation, rated 7/10 at worst, radiates to her left shoulder blade and left arm and associated with tingling, usually lasts about 15 min, occurs many times each day, and is associated with nausea. The pain is sometimes associated with rapid heart rate, SOB, feeling lightheaded, presyncope, and diaphoresis. The pain is sometimes exertional but can occur at rest. Today at work she had some chest pain around 10 am, rated 7/10, associated with feeling lightheaded and pressyncope. On her way here she states the pain decreased to about a 2/10, and then upon arrival to the ED waiting room it returned to a 7/10. She received some ASA/NTG which relieved her pain. There is no association with meals. No chest pain tenderness but there is epigastric tenderness.   Past Medical History  Diagnosis Date  . Migraines      Past Surgical History  Procedure Date  . Rotator cuff repair 2010    left  . Tubal ligation 1998  . Cleft lip repair     Multiple  . Cleft palate repair     Multiple    Allergies  Allergen Reactions  . Amoxicillin Nausea Only  . Iohexol      Desc: Pt had throat tightness, slight swelling. Itchy eyes and itchy gums.  Dr Molli Posey spoke w/ pt.  Treating this like possible/early reaction.  Pt was counseled. Needs full premeds w/ IV contrast, txs., Onset Date: [id number]   . Erythromycin Rash    I have reviewed  the patient's current medications      . sodium chloride 1,000 mL (12/11/12 1225)   nitroGLYCERIN  Prior to Admission medications   Medication Sig Start Date End Date Taking? Authorizing Provider  aspirin 325 MG tablet Take 325 mg by mouth once.   Yes Historical Provider, MD  Aspirin-Acetaminophen-Caffeine (GOODY HEADACHE PO) Take 1 packet by mouth every 4 (four) hours as needed. For headaches   Yes Historical Provider, MD  OVER THE COUNTER MEDICATION Take 2 tablets by mouth once. Sinus pressure medication   Yes Historical Provider, MD     History   Social History  . Marital Status: Married    Spouse Name: N/A    Number of Children: N/A  . Years of Education: N/A   Occupational History  . Packer at [employer]    Social History Main Topics  . Smoking status: Current Every Day Smoker -- 0.1 packs/day for 1 years    Types: Cigarettes    Start date: 04/10/2012  . Smokeless tobacco: Never Used  . Alcohol Use: No  . Drug Use: No  . Sexually Active: Not on file   Other Topics Concern  . Not on file   Social History Narrative  . No narrative on file    Family Status  Relation Status Death Age  . Mother Alive  Asthma, cerebral aneurysm  . Father Deceased 38    Hx MI  . Maternal Grandmother Deceased 52    CVA, MI, COPD  . Maternal Grandfather Deceased 30s    MI  . Paternal Grandmother Deceased 3s    MI  . Paternal Grandfather Other     DM, unknown if alive   ROS:  Full 14 point review of systems complete and found to be negative unless listed above.  Physical Exam: Blood pressure 134/72, pulse 80, temperature 97.9 F (36.6 C), temperature source Oral, resp. rate 18, height 5\' 1"  (1.549 m), weight 140 lb (63.504 kg), last menstrual period 10/30/2012, SpO2 100.00%.  General: Well developed, well nourished, female in no acute distress Head: Eyes PERRLA, No xanthomas.   Normocephalic and atraumatic, oropharynx without edema or exudate. Dentition good. Lungs:  Respirations unlabored. CTA bilaterally. Heart: HRRR S1 S2, no rub/gallop. Pulses are 2+ all 4 extremities.   Neck: No carotid bruits. No lymphadenopathy. No JVD. Abdomen: Bowel sounds present, abdomen soft without masses or hernias noted. Mild tenderness in epigastric and RUQ. Msk:  No spine or cva tenderness. No weakness, no joint deformities or effusions. Extremities: No clubbing or cyanosis. No edema.  Neuro: Alert and oriented X 3. No focal deficits noted. Psych:  Good affect, responds appropriately Skin: No rashes or lesions noted.  Labs:   Lab Results  Component Value Date   WBC 6.0 12/11/2012   HGB 12.8 12/11/2012   HCT 38.2 12/11/2012   MCV 87.2 12/11/2012   PLT 201 12/11/2012    Basename 12/11/12 1142  INR 0.98     Lab 12/11/12 1142  NA 140  K 3.8  CL 105  CO2 21  BUN 15  CREATININE 0.53  CALCIUM 9.0  PROT 6.7  BILITOT 0.3  ALKPHOS 47  ALT 9  AST 15  GLUCOSE 88   No results found for this basename: CKTOTAL:4,CKMB:4,TROPONINI:4 in the last 72 hours  Basename 12/11/12 1508 12/11/12 1231  TROPIPOC 0.00 0.01   Lipase  Date/Time Value Range Status  12/11/2012 11:42 AM 27  11 - 59 U/L Final   Echo: 05/2006 SUMMARY - Overall left ventricular systolic function was at the lower limits of normal. Left ventricular ejection fraction was estimated to be 50 %. There was no diagnostic evidence of left ventricular regional wall motion abnormalities. Left ventricular wall thickness was at the upper limits of normal. Doppler parameters were consistent with abnormal left ventricular relaxation or decreased intravascular volume. - There was mild mitral valvular regurgitation. - Cannot r/o PFO with current study. If clinically indicated consider repeat limited study with agitated saline contrast to evaluate for right to left shunt. IMPRESSIONS - There was no echocardiographic evidence for a cardiac source of embolism.  ECG:   11-Dec-2012 11:08:53  Normal sinus  rhythm Normal ECG 58mm/s 60mm/mV 100Hz  8.0.1 12SL 241 HD CID: 4 Referred by: Unconfirmed Vent. rate 85 BPM PR interval 128 ms QRS duration 86 ms QT/QTc 370/440 ms P-R-T axes 78 40 25  Radiology:  Dg Chest 2 View 12/11/2012  *RADIOLOGY REPORT*  Clinical Data: Chest pain.  CHEST - 2 VIEW  Comparison: None.  Findings: Heart and mediastinal contours are within normal limits. No focal opacities or effusions.  No acute bony abnormality.  IMPRESSION: No active cardiopulmonary disease.   Original Report Authenticated By: Charlett Nose, M.D.     ASSESSMENT AND PLAN:    1. Chest pain, mid-sternal - EKG shows NSR, HR 85. Troponin negative x 1. Continue  cycling enzymes. Admit observation, PRN NTG, heparin if enzymes become elevated. If enzymes remain normal, outpatient stress test. We will check a lipid profile as she has no recent evaluation. Willing to encourage smoking cessation as well. Further evaluation and treatment will depend on the results of the above testing.  Tobacco use - cessation encouraged and will re-inforce  Hx "black" diarrhea - guiac stools and follow CBC  Unknown lipid status - check in am  Signed: Caroline More PA-S2 12/11/2012, 3:39 PM Co-Sign MD  Seen and agreed with changes made. Theodore Demark, PA 12/11/2012 3:49 PM  History and all data above reviewed.  Patient examined.  I agree with the findings as above. Her pain is atypical for angina.  However, she has a strong family history with autopsy proven CAD in her 7 year old father.  She presents with chest pain for the last two days.  There is some reproducibility with RUQ palpation.  She did have a vague description of a black diarrhea stool a couple of weeks ago.   The patient exam reveals COR:RRR  ,  Lungs: Clear  ,  Abd: Positive bowel sounds, no rebound no guarding, positive tenderness to palpation of the RUQ., Ext No edema  .  All available labs, radiology testing, previous records reviewed. Agree with documented  assessment and plan. I suspect primarily that this is GI with a possible ulcer.  However, given the family history we need to exclude obstructive CAD.  I will schedule a dobutamine echo.  Because of a questionable Murphy's sign I will order a RUQ ultrasound to rule out gallstones.  We discussed the need to stop smoking.   Fayrene Fearing Henreitta Spittler  4:59 PM  12/11/2012

## 2012-12-11 NOTE — ED Notes (Addendum)
Pt c/o numbness tingling in left hand, pain in left shoulder blade, and central CP. Pt reports everything has been going on for about a week. Pt reports all symptoms are intermittent. Pt in nad. Ambulatory, no facial droop. Equal hand drips, no arm drift.

## 2012-12-12 ENCOUNTER — Encounter (HOSPITAL_COMMUNITY): Payer: Self-pay | Admitting: General Practice

## 2012-12-12 DIAGNOSIS — R072 Precordial pain: Secondary | ICD-10-CM

## 2012-12-12 LAB — LIPID PANEL
HDL: 56 mg/dL (ref >39)
LDL Cholesterol: 103 mg/dL — ABNORMAL HIGH (ref 0–99)
Triglycerides: 85 mg/dL (ref <150)

## 2012-12-12 LAB — TROPONIN I: Troponin I: <0.3 ng/mL (ref <0.30)

## 2012-12-12 LAB — OCCULT BLOOD X 1 CARD TO LAB, STOOL: Fecal Occult Bld: NEGATIVE

## 2012-12-12 MED ORDER — OMEPRAZOLE 20 MG PO CPDR: 20.0000 mg | DELAYED_RELEASE_CAPSULE | Freq: Every day | ORAL | Status: DC

## 2012-12-12 MED ORDER — PERFLUTREN LIPID MICROSPHERE
INTRAVENOUS | Status: AC
  Filled 2012-12-12: qty 10

## 2012-12-12 NOTE — Progress Notes (Signed)
Cardiology Progress Note Patient Name: [name] Date of Encounter: 12/12/2012, 6:54 AM     Subjective  No chest pain, abd pain, or sob.   Objective   Telemetry: sinus rhythm 60-70s, no arrhythmias   Medications: . aspirin EC  81 mg Oral Daily  . enoxaparin (LOVENOX) injection  40 mg Subcutaneous Q24H  . influenza  inactive virus vaccine  0.5 mL Intramuscular Tomorrow-1000  . metoprolol tartrate  25 mg Oral BID  . pantoprazole  40 mg Oral Q0600  . pneumococcal 23 valent vaccine  0.5 mL Intramuscular Tomorrow-1000  . sodium chloride  3 mL Intravenous Q12H   . sodium chloride 1,000 mL (12/11/12 1225)    Physical Exam: Temp:  [97.5 F (36.4 C)-98.5 F (36.9 C)] 97.5 F (36.4 C) (01/30 0438) Pulse Rate:  [63-91] 63  (01/30 0438) Resp:  [12-24] 16  (01/30 0438) BP: (96-140)/(51-83) 96/68 mmHg (01/30 0438) SpO2:  [96 %-100 %] 96 % (01/30 0438) Weight:  [140 lb (63.504 kg)-147 lb 1.6 oz (66.724 kg)] 147 lb 1.6 oz (66.724 kg) (01/30 0438)  General: Well developed white female, in no acute distress. Head: Normocephalic, atraumatic, sclera non-icteric, nares are without discharge.  Neck: Supple. Negative for carotid bruits or JVD Lungs: Clear bilaterally to auscultation without wheezes, rales, or rhonchi. Breathing is unlabored. Heart: RRR S1 S2 without murmurs, rubs, or gallops.  Abdomen: Soft, non-tender, non-distended with normoactive bowel sounds. No rebound/guarding. No obvious abdominal masses. Msk:  Strength and tone appear normal for age. Extremities: No edema. No clubbing or cyanosis. Distal pedal pulses are intact and equal bilaterally. Neuro: Alert and oriented X 3. Moves all extremities spontaneously. Psych:  Responds to questions appropriately with a normal affect.   Intake/Output Summary (Last 24 hours) at 12/12/12 0654 Last data filed at 12/11/12 2211  Gross per 24 hour  Intake      3 ml  Output      0 ml  Net      3 ml    Labs:  [facility]  12/11/12 1142  NA 140  K 3.8  CL 105  CO2 21  GLUCOSE 88  BUN 15  CREATININE 0.53  CALCIUM 9.0   Basename 12/11/12 1142  AST 15  ALT 9  ALKPHOS 47  BILITOT 0.3  PROT 6.7  ALBUMIN 4.0   Basename 12/11/12 1142  LIPASE 27   Basename 12/11/12 1934 12/11/12 1142  WBC 5.7 6.0  HGB 12.2 12.8  HCT 36.8 38.2  MCV 88.0 87.2  PLT 192 201   Basename 12/12/12 0100 12/11/12 1934  TROPONINI <0.30 <0.30    Radiology/Studies:   12/11/2012 - CHEST - 2 VIEW   Findings: Heart and mediastinal contours are within normal limits. No focal opacities or effusions.  No acute bony abnormality.  IMPRESSION: No active cardiopulmonary disease.    12/11/2012  - COMPLETE ABDOMINAL ULTRASOUND    Findings:  Gallbladder:  A 1.5 cm calcified stone is mobile.  The gallbladder wall thickness is within normal limits at 2 mm.  The technologist reports no sonographic Murphy's sign.  Common bile duct:  The common bile duct is slightly enlarged for age at 6.8 mm.  Liver:  No focal lesion is identified.  Within normal limits in parenchymal echogenicity.  IVC:  Appears normal.  Pancreas:  No focal abnormality seen.  Spleen:  Normal size and echotexture without focal parenchymal abnormality.  The maximal length is 8.6 cm.  Right Kidney:  No hydronephrosis.  Well-preserved cortex.  Normal size and parenchymal echotexture without focal abnormalities. The maximal length is 10.6 cm, within normal limits.  Left Kidney:  No hydronephrosis.  Well-preserved cortex.  Normal size and parenchymal echotexture without focal abnormalities. The maximal length is 10.6 cm, within normal limits.  Abdominal aorta:  No aneurysm identified.  IMPRESSION:  1.  Large gallstone without evidence for cholecystitis. 2.  Slight enlargement of the common bile duct.        Assessment and Plan   1. Precordial chest pain: CXR unremarkable. EKG nonischemic. Troponin normal x2. Currently chest pain free. Stress echo today with further plans pending  results. Lipid panel pending.  2. GI: H/o "black" diarrhea and RUQ pain. Lipase normal. H&H WNL. Abd US showed large gallstone and slight enlargement of common bile duct, no evidence of cholecystitis. Occult stool pending.  3. Tobacco Abuse: Encouraged cessation. Does not want nicotine patch   Signed, HOPE, JESSICA PA-C  Rollene Rotunda  3:22 PM  12/12/2012  History and all data above reviewed.  Patient examined.  I agree with the findings as above.  The patient continues to have chest pain.  Dobutamine echo was normal.  EF was normal.  She did have a dilated bile duct with stones on GB ultrasound.  She also had a recent black stool.  The patient exam reveals COR:RRR, no rub no murmur  ,  Lungs: Clear  ,  Abd: Positive bowel sounds, no rebound no guarding, Ext No edema  .  All available labs, radiology testing, previous records reviewed. Agree with documented assessment and plan. No cardiac etiology to her pain.  Given the ongoing pain and severe nature on presentation I will ask GI to evaluate.    Fayrene Fearing Merick Kelleher  3:27 PM  12/12/2012

## 2012-12-12 NOTE — H&P (Signed)
Admission Note    Patient ID: [name] MRN: [id number] DOB/AGE: 1971-02-23 42 y.o.   Admit date: 12/11/2012   Primary Physician   User Centricity, MD Primary Cardiologist   None  Reason for Consultation   Chest Pain   HPI: 42 year old female with no past history of CAD but is a current smoker presents to the ED with chest pain. She has had some chest pain on and off for the last year, but she states these episodes are isolated and occur once every month or two.    She has had intermittent chest pain for the past 3-4 days. It is a substernal pinching and pressure sensation, rated 7/10 at worst, radiates to her left shoulder blade and left arm and associated with tingling, usually lasts about 15 min, occurs many times each day, and is associated with nausea. The pain is sometimes associated with rapid heart rate, SOB, feeling lightheaded, presyncope, and diaphoresis. The pain is sometimes exertional but can occur at rest. Today at work she had some chest pain around 10 am, rated 7/10, associated with feeling lightheaded and pressyncope. On her way here she states the pain decreased to about a 2/10, and then upon arrival to the ED waiting room it returned to a 7/10. She received some ASA/NTG which relieved her pain. There is no association with meals. No chest pain tenderness but there is epigastric tenderness.      Past Medical History   Diagnosis  Date   .  Migraines         Past Surgical History   Procedure  Date   .  Rotator cuff repair  2010       left   .  Tubal ligation  1998   .  Cleft lip repair         Multiple   .  Cleft palate repair         Multiple       Allergies   Allergen  Reactions   .  Amoxicillin  Nausea Only   .  Iohexol         Desc: Pt had throat tightness, slight swelling. Itchy eyes and itchy gums.  Dr Molli Posey spoke w/ pt.  Treating this like possible/early reaction.  Pt was counseled. Needs full premeds w/ IV contrast, txs., Onset Date:  [id number]     .  Erythromycin  Rash      I have reviewed the patient's current medications    .  sodium chloride  1,000 mL (12/11/12 1225)    nitroGLYCERIN    Prior to Admission medications    Medication  Sig  Start Date  End Date  Taking?  Authorizing Provider   aspirin 325 MG tablet  Take 325 mg by mouth once.      Yes  Historical Provider, MD   Aspirin-Acetaminophen-Caffeine (GOODY HEADACHE PO)  Take 1 packet by mouth every 4 (four) hours as needed. For headaches      Yes  Historical Provider, MD   OVER THE COUNTER MEDICATION  Take 2 tablets by mouth once. Sinus pressure medication      Yes  Historical Provider, MD        History       Social History   .  Marital Status:  Married       Spouse Name:  N/A       Number of Children:  N/A   .  Years of  Education:  N/A       Occupational History   .  Packer at [employer]         Social History Main Topics   .  Smoking status:  Current Every Day Smoker -- 0.1 packs/day for 1 years       Types:  Cigarettes       Start date:  04/10/2012   .  Smokeless tobacco:  Never Used   .  Alcohol Use:  No   .  Drug Use:  No   .  Sexually Active:  Not on file       Other Topics  Concern   .  Not on file       Social History Narrative   .  No narrative on file      Family Status   Relation  Status  Death Age   .  Mother  Alive         Asthma, cerebral aneurysm   .  Father  Deceased  58       Hx MI   .  Maternal Grandmother  Deceased  19       CVA, MI, COPD   .  Maternal Grandfather  Deceased  4s       MI   .  Paternal Grandmother  Deceased  96s       MI   .  Paternal Grandfather  Other         DM, unknown if alive    ROS:  Full 14 point review of systems complete and found to be negative unless listed above.   Physical Exam: Blood pressure 134/72, pulse 80, temperature 97.9 F (36.6 C), temperature source Oral, resp. rate 18, height 5\' 1"  (1.549 m), weight 140 lb (63.504 kg), last menstrual period 10/30/2012,  SpO2 100.00%.  General: Well developed, well nourished, female in no acute distress Head: Eyes PERRLA, No xanthomas.   Normocephalic and atraumatic, oropharynx without edema or exudate. Dentition good. Lungs: Respirations unlabored. CTA bilaterally. Heart: HRRR S1 S2, no rub/gallop. Pulses are 2+ all 4 extremities.    Neck: No carotid bruits. No lymphadenopathy. No JVD. Abdomen: Bowel sounds present, abdomen soft without masses or hernias noted. Mild tenderness in epigastric and RUQ. Msk:  No spine or cva tenderness. No weakness, no joint deformities or effusions. Extremities: No clubbing or cyanosis. No edema.   Neuro: Alert and oriented X 3. No focal deficits noted. Psych:  Good affect, responds appropriately Skin: No rashes or lesions noted.   Labs:    Lab Results   Component  Value  Date     WBC  6.0  12/11/2012     HGB  12.8  12/11/2012     HCT  38.2  12/11/2012     MCV  87.2  12/11/2012     PLT  201  12/11/2012    Basename  12/11/12 1142   INR  0.98      Lab  12/11/12 1142   NA  140   K  3.8   CL  105   CO2  21   BUN  15   CREATININE  0.53   CALCIUM  9.0   PROT  6.7   BILITOT  0.3   ALKPHOS  47   ALT  9   AST  15   GLUCOSE  88    No results found for this basename: CKTOTAL:4,CKMB:4,TROPONINI:4 in the last 72 hours Basename  12/11/12 1508  12/11/12 1231   TROPIPOC  0.00  0.01    Lipase   Date/Time  Value  Range  Status   12/11/2012 11:42 AM  27   11 - 59 U/L  Final    Echo: 05/2006 SUMMARY - Overall left ventricular systolic function was at the lower limits of normal. Left ventricular ejection fraction was estimated to be 50 %. There was no diagnostic evidence of left ventricular regional wall motion abnormalities. Left ventricular wall thickness was at the upper limits of normal. Doppler parameters were consistent with abnormal left ventricular relaxation or decreased intravascular volume. - There was mild mitral valvular regurgitation. - Cannot r/o PFO  with current study. If clinically indicated consider repeat limited study with agitated saline contrast to evaluate for right to left shunt. IMPRESSIONS - There was no echocardiographic evidence for a cardiac source of embolism.   ECG:    11-Dec-2012 11:08:53   Normal sinus rhythm Normal ECG 80mm/s 79mm/mV 100Hz  8.0.1 12SL 241 HD CID: 4 Referred by: Unconfirmed Vent. rate 85 BPM PR interval 128 ms QRS duration 86 ms QT/QTc 370/440 ms P-R-T axes 78 40 25   Radiology:  Dg Chest 2 View 12/11/2012  *RADIOLOGY REPORT*  Clinical Data: Chest pain.  CHEST - 2 VIEW  Comparison: None.  Findings: Heart and mediastinal contours are within normal limits. No focal opacities or effusions.  No acute bony abnormality.  IMPRESSION: No active cardiopulmonary disease.   Original Report Authenticated By: Charlett Nose, M.D.       ASSESSMENT AND PLAN:     1. Chest pain, mid-sternal - EKG shows NSR, HR 85. Troponin negative x 1. Continue cycling enzymes. Admit observation, PRN NTG, heparin if enzymes become elevated. If enzymes remain normal, outpatient stress test. We will check a lipid profile as she has no recent evaluation. Willing to encourage smoking cessation as well. Further evaluation and treatment will depend on the results of the above testing.   Tobacco use - cessation encouraged and will re-inforce   Hx "black" diarrhea - guiac stools and follow CBC   Unknown lipid status - check in am   Signed: Caroline More PA-S2 12/11/2012, 3:39 PM Co-Sign MD   Seen and agreed with changes made. Theodore Demark, PA 12/11/2012 3:49 PM   History and all data above reviewed.  Patient examined.  I agree with the findings as above. Her pain is atypical for angina.  However, she has a strong family history with autopsy proven CAD in her 44 year old father.  She presents with chest pain for the last two days.  There is some reproducibility with RUQ palpation.  She did have a vague description of a  black diarrhea stool a couple of weeks ago.   The patient exam reveals COR:RRR  ,  Lungs: Clear  ,  Abd: Positive bowel sounds, no rebound no guarding, positive tenderness to palpation of the RUQ., Ext No edema  .  All available labs, radiology testing, previous records reviewed. Agree with documented assessment and plan. I suspect primarily that this is GI with a possible ulcer.  However, given the family history we need to exclude obstructive CAD.  I will schedule a dobutamine echo.  Because of a questionable Murphy's sign I will order a RUQ ultrasound to rule out gallstones.  We discussed the need to stop smoking.   Fayrene Fearing Jaheem Hedgepath  4:59 PM  12/11/2012           Revision History...     Date/Time User  Action   12/11/2012 5:14 PM Rollene Rotunda, MD Sign   12/11/2012 5:06 PM Darrol Jump, PA Sign   12/11/2012 5:04 PM Rollene Rotunda, MD Incomplete Revision   12/11/2012 3:50 PM Darrol Jump, PA Sign   12/11/2012 3:17 PM Caroline More, Student-PA Pend  View Details Report

## 2012-12-12 NOTE — Discharge Summary (Signed)
Discharge Summary   Patient ID: [name] MRN: [id number], DOB/AGE: 44/03/1971 44 y.o. Admit date: 12/11/2012 D/C date:     12/12/2012  Primary Cardiologist: Dr. Clifton James (this adm)  Primary Discharge Diagnoses:  1. Epigastric pain possibly GERD, for outpatient GI workup 2. Noncardiac chest pain - normal stress echo 3. Cholelithasis  Secondary Discharge Diagnoses:  1. Migraines 2. Anxiety 3. Depression  Hospital Course: [name] is a 42 y/o F with no prior history of CAD, but a history of tobacco abuse and strong family history of CAD in her father who did of MI at 60. She presented with chest pain to the ER yesterday. She reports pain on/off for the last year, isolated and occurring every month or two. However, for the past 3-4 days, she has had substernal pinching and pressure radiating to her left shoulder blade and left arm and associated with tingling. It usually lasts 15 minutes, occurs many times each day and is associated with nausea. The pain is sometimes associated with rapid heart rate, SOB, feeling lightheaded, presyncope, and diaphoresis. While at work, she had an episode of pain associated with lightheadedness and presyncope. Upon arrival to the ER it was 7/10. She received some ASA/NTG which relieved her pain. She had no chest tenderness but there was epigastric tenderness. Initial troponin was normal and EKG was nonacute. Abd [place] was obtained showing large gallstone and slight enlargement of CBD. LFTs, lipase were normal. She was admitted for cardiac rule-out. Troponins remained negative. Telemetry did not show any arrhythmias. She underwent stress echocardiogram which was normal. The patient also noted a black stool a month ago with some diarrhea. FOBT was negative here. GI evaluated the patient. Dr. Elnoria Howard felt she may have GERD issues and recommended PPI daily. If her symptoms do not improve, then he feels an EGD would be necessary. As for her gallstones, he was not sure she  has any symptoms from it at this time, but it is still an issue to consider. He felt she was stable for discharge and should follow up with Dr. Loreta Ave in 1-2 weeks. Dr. Antoine Poche has seen and examined her today and feels she is stable for discharge. He did not feel any further cardiac followup was warranted at this time. The patient reported being on low dose Xanax at home in the past and requested this at discharge. She was informed that we do not write for this medicine since we will not be following her for this, and she should follow up with her primary care doctor for her anxiety.   Discharge Vitals: Blood pressure 95/54, pulse 80, temperature 97.5 F (36.4 C), temperature source Oral, resp. rate 17, height 5\' 1"  (1.549 m), weight 147 lb 1.6 oz (66.724 kg), last menstrual period 10/30/2012, SpO2 98.00%.  Labs: Lab Results  Component Value Date   WBC 5.7 12/11/2012   HGB 12.2 12/11/2012   HCT 36.8 12/11/2012   MCV 88.0 12/11/2012   PLT 192 12/11/2012     Lab 12/11/12 1934 12/11/12 1142  NA -- 140  K -- 3.8  CL -- 105  CO2 -- 21  BUN -- 15  CREATININE 0.61 --  CALCIUM -- 9.0  PROT -- 6.7  BILITOT -- 0.3  ALKPHOS -- 47  ALT -- 9  AST -- 15  GLUCOSE -- 88    Basename 12/12/12 1115 12/12/12 0100 12/11/12 1934  CKTOTAL -- -- --  CKMB -- -- --  TROPONINI <0.30 <0.30 <0.30   Lab  Results  Component Value Date   CHOL 176 12/12/2012   HDL 56 12/12/2012   LDLCALC 103* 12/12/2012   TRIG 85 12/12/2012    Diagnostic Studies/Procedures   12/12/12 Stress Echo  Study Conclusions: - Stress ECG conclusions: The stress ECG was normal. - Staged echo: Normal echo stress Impressions: - Normal study after maximal exercise. Good exercise tolerance. Patient had some chest tightness on treadmill but no evidence for ischemia by ECG or echo. Bruce protocol. Stress echocardiography. Patient status: Inpatient.  Dg Chest 2 View 12/11/2012  *RADIOLOGY REPORT*  Clinical Data: Chest pain.  CHEST - 2 VIEW   Comparison: None.  Findings: Heart and mediastinal contours are within normal limits. No focal opacities or effusions.  No acute bony abnormality.  IMPRESSION: No active cardiopulmonary disease.   Original Report Authenticated By: Charlett Nose, M.D.    US Abdomen Complete 12/11/2012  *RADIOLOGY REPORT*  Clinical Data:  Right quadrant tenderness.  COMPLETE ABDOMINAL ULTRASOUND  Comparison:  CT of the abdomen with contrast 10/05/2008.  Findings:  Gallbladder:  A 1.5 cm calcified stone is mobile.  The gallbladder wall thickness is within normal limits at 2 mm.  The technologist reports no sonographic Murphy's sign.  Common bile duct:  The common bile duct is slightly enlarged for age at 6.8 mm.  Liver:  No focal lesion is identified.  Within normal limits in parenchymal echogenicity.  IVC:  Appears normal.  Pancreas:  No focal abnormality seen.  Spleen:  Normal size and echotexture without focal parenchymal abnormality.  The maximal length is 8.6 cm.  Right Kidney:  No hydronephrosis.  Well-preserved cortex.  Normal size and parenchymal echotexture without focal abnormalities. The maximal length is 10.6 cm, within normal limits.  Left Kidney:  No hydronephrosis.  Well-preserved cortex.  Normal size and parenchymal echotexture without focal abnormalities. The maximal length is 10.6 cm, within normal limits.  Abdominal aorta:  No aneurysm identified.  IMPRESSION:  1.  Large gallstone without evidence for cholecystitis. 2.  Slight enlargement of the common bile duct.   Original Report Authenticated By: Marin Roberts, M.D.     Discharge Medications   Current Discharge Medication List    START taking these medications   Details  omeprazole (PRILOSEC) 20 MG capsule Take 1 capsule (20 mg total) by mouth daily. Qty: 30 capsule, Refills: 1      STOP taking these medications        Aspirin-Acetaminophen-Caffeine (GOODY HEADACHE PO) Comments:  Reason for Stopping:       OVER THE COUNTER MEDICATION  Comments:  Reason for Stopping:          Disposition   The patient will be discharged in stable condition to home. Discharge Orders    Future Orders Please Complete By Expires   Diet - low sodium heart healthy      Increase activity slowly      Discharge instructions      Comments:   Stop smoking. The caffeine in Goody Headache medicine can sometimes bring on palpitations. You can try discontinuing this medicine to see if you feel better. We would advise trying Tylenol over-the-counter as directed instead for pain.     Follow-up Information    Follow up with Primary Care Doctor. (For your routine medical followup)       Follow up with MANN,JYOTHI, MD. (Please call her office tomorrow for a followup appointment within 1-2 weeks)    Contact information:   [address], [address] [place]  [address] [phone]            Duration of Discharge Encounter: Greater than 30 minutes including physician and PA time.  Signed, Ronie Spies PA-C 12/12/2012, 5:44 PM

## 2012-12-12 NOTE — Progress Notes (Signed)
DC orders received.  Patient stable with no S/S of distress.  Medication and discharge information reviewed with patient.  Patient DC home. [name]  

## 2012-12-12 NOTE — Care Management Note (Signed)
    Page 1 of 1   12/12/2012     2:46:35 PM   CARE MANAGEMENT NOTE 12/12/2012  Patient:  [name], [name]   Account Number:  [id number]  Date Initiated:  12/12/2012  Documentation initiated by:  GRAVES-BIGELOW,Aimee Heldman  Subjective/Objective Assessment:   Pt admitted with cp. Plans for possible d/c home today.     Action/Plan:   CM attemptempted to speak to pt and Admissions RN in the room. CM will f/u with pt in regards to medications and PCP.   Anticipated DC Date:  12/12/2012   Anticipated DC Plan:  HOME/SELF CARE      DC Planning Services  CM consult  Medication Assistance      Choice offered to / List presented to:             Status of service:  Completed, signed off Medicare Important Message given?   (If response is "NO", the following Medicare IM given date fields will be blank) Date Medicare IM given:   Date Additional Medicare IM given:    Discharge Disposition:  HOME/SELF CARE  Per UR Regulation:  Reviewed for med. necessity/level of care/duration of stay  If discussed at Long Length of Stay Meetings, dates discussed:    Comments:  12-12-12 [address] Melene Muller, RN,BSN [phone] CM did speak to pt and provided pt information about PCP at [facility] and provided her with cost and directions. CM did look at [facility] and seems like pt will be d/c on asa and metoprolol. Medications are cheap at [facility]. No further needs from CM at this time.

## 2012-12-12 NOTE — Progress Notes (Signed)
UR Completed Latina Frank Graves-Bigelow, RN,BSN [phone]  

## 2012-12-12 NOTE — Progress Notes (Signed)
Flu shot not administered to patient due to patient having a latex allergy. [place], [name]

## 2012-12-12 NOTE — Consult Note (Signed)
Reason for Consult: Noncardiac chest pain Referring Physician: [facility]  [name] HPI: This is a 42 year old female who is admitted for chest pain.  The patient reports having chest pain for several days, but it continued to worsen.  She was evaluated in the ER and she responded to the use of SL NTG.  Further evaluation was performed and she was ruled out for any cardiac etiology.  No problems with nausea, vomiting, or any overt history of GERD.  Her pain is in the epigastric region and it radiates down her left arm as well as to her back.  In the past she was evaluated by Dr. Loreta Ave for constipation issues.  The RUQ U/S revealed a large 1.5 cm mobile stone and a mild dilation of her CBD.  No abnormalities with her liver enzymes.  She did report having an episode of black stools one month ago, but her HGB is normal.  The patient denied taking any Peptobismol at that time when she was having diarrhea.  It responded to a half dose of Imodium.  Past Medical History  Diagnosis Date  . Migraines   . Complication of anesthesia   . PONV (postoperative nausea and vomiting)   . Anxiety   . Depression   . Shortness of breath     " at times "  . GERD (gastroesophageal reflux disease)     Past Surgical History  Procedure Date  . Rotator cuff repair 2010    left  . Tubal ligation 1998  . Cleft lip repair     Multiple  . Cleft palate repair     Multiple    History reviewed. No pertinent family history.  Social History:  reports that she has been smoking Cigarettes.  She started smoking about 8 months ago. She has a .1 pack-year smoking history. She has never used smokeless tobacco. She reports that she does not drink alcohol or use illicit drugs.  Allergies:  Allergies  Allergen Reactions  . Latex Hives, Itching and Rash  . Amoxicillin Nausea Only  . Iohexol      Desc: Pt had throat tightness, slight swelling. Itchy eyes and itchy gums.  Dr Molli Posey spoke w/ pt.  Treating this  like possible/early reaction.  Pt was counseled. Needs full premeds w/ IV contrast, txs., Onset Date: [id number]   . Erythromycin Rash    Medications:  Scheduled:   . aspirin EC  81 mg Oral Daily  . enoxaparin (LOVENOX) injection  40 mg Subcutaneous Q24H  . influenza  inactive virus vaccine  0.5 mL Intramuscular Tomorrow-1000  . metoprolol tartrate  25 mg Oral BID  . pantoprazole  40 mg Oral Q0600  . sodium chloride  3 mL Intravenous Q12H   Continuous:   . sodium chloride 1,000 mL (12/11/12 1225)    Results for orders placed during the hospital encounter of 12/11/12 (from the past 24 hour(s))  TROPONIN I     Status: Normal   Collection Time   12/11/12  7:34 PM      Component Value Range   Troponin I <0.30  <0.30 ng/mL  CBC     Status: Normal   Collection Time   12/11/12  7:34 PM      Component Value Range   WBC 5.7  4.0 - 10.5 K/uL   RBC 4.18  3.87 - 5.11 MIL/uL   Hemoglobin 12.2  12.0 - 15.0 g/dL   HCT 29.5  28.4 - 13.2 %  MCV 88.0  78.0 - 100.0 fL   MCH 29.2  26.0 - 34.0 pg   MCHC 33.2  30.0 - 36.0 g/dL   RDW 04.5  40.9 - 81.1 %   Platelets 192  150 - 400 K/uL  CREATININE, SERUM     Status: Normal   Collection Time   12/11/12  7:34 PM      Component Value Range   Creatinine, Ser 0.61  0.50 - 1.10 mg/dL   GFR calc non Af Amer >90  >90 mL/min   GFR calc Af Amer >90  >90 mL/min  TROPONIN I     Status: Normal   Collection Time   12/12/12  1:00 AM      Component Value Range   Troponin I <0.30  <0.30 ng/mL  TROPONIN I     Status: Normal   Collection Time   12/12/12 11:15 AM      Component Value Range   Troponin I <0.30  <0.30 ng/mL  LIPID PANEL     Status: Abnormal   Collection Time   12/12/12 11:15 AM      Component Value Range   Cholesterol 176  0 - 200 mg/dL   Triglycerides 85  <914 mg/dL   HDL 56  >78 mg/dL   Total CHOL/HDL Ratio 3.1     VLDL 17  0 - 40 mg/dL   LDL Cholesterol 295 (*) 0 - 99 mg/dL  OCCULT BLOOD X 1 CARD TO LAB, STOOL     Status: Normal    Collection Time   12/12/12  3:55 PM      Component Value Range   Fecal Occult Bld NEGATIVE  NEGATIVE     Dg Chest 2 View  12/11/2012  *RADIOLOGY REPORT*  Clinical Data: Chest pain.  CHEST - 2 VIEW  Comparison: None.  Findings: Heart and mediastinal contours are within normal limits. No focal opacities or effusions.  No acute bony abnormality.  IMPRESSION: No active cardiopulmonary disease.   Original Report Authenticated By: Charlett Nose, M.D.    US Abdomen Complete  12/11/2012  *RADIOLOGY REPORT*  Clinical Data:  Right quadrant tenderness.  COMPLETE ABDOMINAL ULTRASOUND  Comparison:  CT of the abdomen with contrast 10/05/2008.  Findings:  Gallbladder:  A 1.5 cm calcified stone is mobile.  The gallbladder wall thickness is within normal limits at 2 mm.  The technologist reports no sonographic Murphy's sign.  Common bile duct:  The common bile duct is slightly enlarged for age at 6.8 mm.  Liver:  No focal lesion is identified.  Within normal limits in parenchymal echogenicity.  IVC:  Appears normal.  Pancreas:  No focal abnormality seen.  Spleen:  Normal size and echotexture without focal parenchymal abnormality.  The maximal length is 8.6 cm.  Right Kidney:  No hydronephrosis.  Well-preserved cortex.  Normal size and parenchymal echotexture without focal abnormalities. The maximal length is 10.6 cm, within normal limits.  Left Kidney:  No hydronephrosis.  Well-preserved cortex.  Normal size and parenchymal echotexture without focal abnormalities. The maximal length is 10.6 cm, within normal limits.  Abdominal aorta:  No aneurysm identified.  IMPRESSION:  1.  Large gallstone without evidence for cholecystitis. 2.  Slight enlargement of the common bile duct.   Original Report Authenticated By: Marin Roberts, M.D.     ROS:  As stated above in the HPI otherwise negative.  Blood pressure 95/54, pulse 80, temperature 97.5 F (36.4 C), temperature source Oral, resp. rate 17, height 5\' 1"  (1.549  m),  weight 147 lb 1.6 oz (66.724 kg), last menstrual period 10/30/2012, SpO2 98.00%.    PE: Gen: NAD, Alert and Oriented HEENT:  [place]/AT, EOMI Neck: Supple, no LAD Lungs: CTA Bilaterally CV: RRR without M/G/R ABM: Soft, mild epigastric tenderness, +BS Ext: No C/C/E  Assessment/Plan: 1) Epigastric pain. 2) Noncardiac chest pain. 3) Cholelithiasis.   The patient may have GERD issues and I think she needs to be treated with a PPI.  If her symptoms do not improve then an EGD is necessary.  As for her gallstone, I am not sure that she has any symptoms from it at this time, but it is still an issue to consider.  She is stable and I think she can go home and have outpatient follow up.  Plan: 1) PPI of choice QD. 2) Okay to D/C home. 3) Follow up with Dr. Loreta Ave in 1-2 weeks.  Giannah Zavadil D 12/12/2012, 5:03 PM

## 2012-12-12 NOTE — Progress Notes (Signed)
  Echocardiogram Echocardiogram Stress Test has been performed.  [name] 12/12/2012, 9:38 AM

## 2012-12-20 ENCOUNTER — Encounter (HOSPITAL_COMMUNITY): Payer: Self-pay

## 2012-12-20 ENCOUNTER — Emergency Department (HOSPITAL_COMMUNITY)
Admission: EM | Admit: 2012-12-20 | Discharge: 2012-12-20 | Disposition: A | Discharge status: Home / Self Care | Payer: Self-pay | Source: Home / Self Care | Attending: Family Medicine | Admitting: Family Medicine

## 2012-12-20 DIAGNOSIS — F419 Anxiety disorder, unspecified: Secondary | ICD-10-CM

## 2012-12-20 DIAGNOSIS — Z720 Tobacco use: Secondary | ICD-10-CM

## 2012-12-20 DIAGNOSIS — F17200 Nicotine dependence, unspecified, uncomplicated: Secondary | ICD-10-CM

## 2012-12-20 NOTE — ED Provider Notes (Signed)
History   CSN: [id number]  Arrival date & time 12/20/12  1529   First MD Initiated Contact with Patient 12/20/12 1619    Chief Complaint  Patient presents with  . Follow-up   HPI The patient is presenting today for a hospital followup.  She was seen in the emergency department and admitted for chest pain.  She was found to have acid reflux disease and was seen by gastroenterology.  She was scheduled to followup with gastroenterology.  The patient reports that she tried to followup with gastroenterology but the cost was prohibitive.  She says she would have to pay over $150 to be seen.  The patient also reports that she has taken Xanax in the past but she does not want to take any SSRIs.  She has taken them in the past for anxiety and depression but had some difficulty with withdrawal.  The patient reports that she is able to deal with her anxiety by exercise.  She also smokes.  She reports that she is motivated to quit smoking and she has done it in the past.  She was able to quit smoking for 22 years and her recent divorce got her back into smoking.  She reports that her anxiety is not severe and she's not having any panic episodes.    Past Medical History  Diagnosis Date  . Migraines   . Complication of anesthesia   . PONV (postoperative nausea and vomiting)   . Anxiety   . Depression   . GERD (gastroesophageal reflux disease)   . Chest pain     a. Felt noncardiac with normal stress echo 11/2012.  Marland Kitchen Cholelithiasis     a. on Abd [place] 11/2012.    Past Surgical History  Procedure Date  . Rotator cuff repair 2010    left  . Tubal ligation 1998  . Cleft lip repair     Multiple  . Cleft palate repair     Multiple    FAMILY HISTORY Significant coronary artery disease on both sides of the family  History  Substance Use Topics  . Smoking status: Current Every Day Smoker -- 0.1 packs/day for 1 years    Types: Cigarettes    Start date: 04/10/2012  . Smokeless tobacco: Never Used  .  Alcohol Use: No    OB History    Grav Para Term Preterm Abortions TAB SAB Ect Mult Living                 Review of Systems  Constitutional: Negative.   HENT: Negative.   Respiratory: Negative.   Cardiovascular: Negative.   Gastrointestinal: Negative.   Musculoskeletal: Negative.   Neurological: Negative.   Hematological: Negative.   Psychiatric/Behavioral: Positive for dysphoric mood.  All other systems reviewed and are negative.    Allergies  Latex; Amoxicillin; Iohexol; and Erythromycin  Home Medications   Current Outpatient Rx  Name  Route  Sig  Dispense  Refill  . OMEPRAZOLE 20 MG PO CPDR   Oral   Take 1 capsule (20 mg total) by mouth daily.   30 capsule   1     BP 139/73  Pulse 82  Temp 98 F (36.7 C) (Oral)  Resp 19  SpO2 99%  LMP 10/30/2012  Physical Exam  Nursing note and vitals reviewed. Constitutional: She is oriented to person, place, and time. She appears well-developed and well-nourished. No distress.  HENT:  Head: Normocephalic and atraumatic.  Left Ear: External ear normal.  Eyes: Conjunctivae  normal and EOM are normal. Pupils are equal, round, and reactive to light.  Neck: Normal range of motion. Neck supple.  Cardiovascular: Normal rate, regular rhythm and normal heart sounds.   Pulmonary/Chest: Effort normal and breath sounds normal.  Abdominal: Soft. Bowel sounds are normal.  Musculoskeletal: Normal range of motion.  Neurological: She is alert and oriented to person, place, and time.  Skin: Skin is warm and dry. No rash noted. No erythema. No pallor.  Psychiatric: She has a normal mood and affect. Her behavior is normal. Judgment and thought content normal.    ED Course  Procedures (including critical care time)  Labs Reviewed - No data to display No results found.   No diagnosis found.  MDM  IMPRESSION  Anxiety Disorder  GERD  Tobacco Use   RECOMMENDATIONS / PLAN Pt is working hard to quit tobacco use Pt doesn't  want to use any antidepressants or nicotine patches or chantix I strongly encouraged her to stop using tobacco products.  I also offered assistance with medications including nicotine patches and other medications but she refused.  She also refused SSRI treatment for her anxiety.  She also refused behavioral health consultation and referral.  The patient says she has her own private primary care physician that she's going to followup with.  She says she will discuss it further with her personal physician.  FOLLOW UP As needed  The patient was given clear instructions to go to ER or return to medical center if symptoms don't improve, worsen or new problems develop.  The patient verbalized understanding.  The patient was told to call to get lab results if they haven't heard anything in the next week.            Cleora Fleet, MD 12/20/12 [phone]

## 2012-12-20 NOTE — ED Notes (Signed)
Hospital follow up- patient states she had gall stones, HTN, would like to quit smoking

## 2013-07-18 ENCOUNTER — Encounter (HOSPITAL_COMMUNITY): Payer: Self-pay | Admitting: Emergency Medicine

## 2013-07-18 ENCOUNTER — Emergency Department (HOSPITAL_COMMUNITY): Payer: Self-pay

## 2013-07-18 ENCOUNTER — Emergency Department (HOSPITAL_COMMUNITY)
Admission: EM | Admit: 2013-07-18 | Discharge: 2013-07-18 | Disposition: A | Discharge status: Home / Self Care | Payer: Self-pay | Attending: Emergency Medicine | Admitting: Emergency Medicine

## 2013-07-18 DIAGNOSIS — R112 Nausea with vomiting, unspecified: Secondary | ICD-10-CM | POA: Insufficient documentation

## 2013-07-18 DIAGNOSIS — F17200 Nicotine dependence, unspecified, uncomplicated: Secondary | ICD-10-CM | POA: Insufficient documentation

## 2013-07-18 DIAGNOSIS — Z8659 Personal history of other mental and behavioral disorders: Secondary | ICD-10-CM | POA: Insufficient documentation

## 2013-07-18 DIAGNOSIS — R1011 Right upper quadrant pain: Secondary | ICD-10-CM | POA: Insufficient documentation

## 2013-07-18 DIAGNOSIS — R1013 Epigastric pain: Secondary | ICD-10-CM | POA: Insufficient documentation

## 2013-07-18 DIAGNOSIS — Z8679 Personal history of other diseases of the circulatory system: Secondary | ICD-10-CM | POA: Insufficient documentation

## 2013-07-18 DIAGNOSIS — K8020 Calculus of gallbladder without cholecystitis without obstruction: Secondary | ICD-10-CM | POA: Insufficient documentation

## 2013-07-18 DIAGNOSIS — K219 Gastro-esophageal reflux disease without esophagitis: Secondary | ICD-10-CM | POA: Insufficient documentation

## 2013-07-18 DIAGNOSIS — Z880 Allergy status to penicillin: Secondary | ICD-10-CM | POA: Insufficient documentation

## 2013-07-18 DIAGNOSIS — Z79899 Other long term (current) drug therapy: Secondary | ICD-10-CM | POA: Insufficient documentation

## 2013-07-18 DIAGNOSIS — Z91040 Latex allergy status: Secondary | ICD-10-CM | POA: Insufficient documentation

## 2013-07-18 LAB — COMPREHENSIVE METABOLIC PANEL
ALT: 7 U/L (ref 0–35)
AST: 12 U/L (ref 0–37)
Calcium: 8.6 mg/dL (ref 8.4–10.5)
GFR calc Af Amer: >90 mL/min (ref >90)
Sodium: 138 mEq/L (ref 135–145)
Total Protein: 6.7 g/dL (ref 6.0–8.3)

## 2013-07-18 LAB — URINALYSIS, ROUTINE W REFLEX MICROSCOPIC
Glucose, UA: NEGATIVE mg/dL
Hgb urine dipstick: NEGATIVE
Specific Gravity, Urine: 1.016 (ref 1.005–1.030)
Urobilinogen, UA: 0.2 mg/dL (ref 0.0–1.0)

## 2013-07-18 LAB — CBC WITH DIFFERENTIAL/PLATELET
Basophils Absolute: 0 10*3/uL (ref 0.0–0.1)
Basophils Relative: 0 % (ref 0–1)
Eosinophils Absolute: 0 10*3/uL (ref 0.0–0.7)
Eosinophils Relative: 0 % (ref 0–5)
MCH: 26.5 pg (ref 26.0–34.0)
MCV: 82.9 fL (ref 78.0–100.0)
Platelets: 224 10*3/uL (ref 150–400)
RDW: 13 % (ref 11.5–15.5)

## 2013-07-18 LAB — URINE MICROSCOPIC-ADD ON

## 2013-07-18 MED ORDER — HYDROCODONE-ACETAMINOPHEN 5-325 MG PO TABS: 1.0000 | ORAL_TABLET | ORAL | Status: DC | PRN

## 2013-07-18 MED ORDER — SODIUM CHLORIDE 0.9 % IV BOLUS (SEPSIS)
1000.0000 mL | Freq: Once | INTRAVENOUS | Status: AC
  Administered 2013-07-18: 1000 mL via INTRAVENOUS

## 2013-07-18 MED ORDER — ONDANSETRON HCL 4 MG/2ML IJ SOLN
4.0000 mg | Freq: Once | INTRAMUSCULAR | Status: AC
  Administered 2013-07-18: 4 mg via INTRAVENOUS
  Filled 2013-07-18: qty 2

## 2013-07-18 MED ORDER — PROMETHAZINE HCL 25 MG PO TABS: 25.0000 mg | ORAL_TABLET | Freq: Four times a day (QID) | ORAL | Status: DC | PRN

## 2013-07-18 MED ORDER — MORPHINE SULFATE 4 MG/ML IJ SOLN
4.0000 mg | Freq: Once | INTRAMUSCULAR | Status: AC
  Administered 2013-07-18: 4 mg via INTRAVENOUS
  Filled 2013-07-18: qty 1

## 2013-07-18 MED ORDER — MORPHINE SULFATE 4 MG/ML IJ SOLN
4.0000 mg | Freq: Once | INTRAMUSCULAR | Status: AC
  Administered 2013-07-18: 4 mg via INTRAVENOUS
  Filled 2013-07-18 (×2): qty 1

## 2013-07-18 NOTE — ED Notes (Signed)
Per patient, back pain, upper, between shoulder blades on and off for a year-mid sternal chest pain as well-was worked up by cardiology and tests were negative-

## 2013-07-18 NOTE — ED Provider Notes (Signed)
CSN: [id number]     Arrival date & time 07/18/13  0911 History   First MD Initiated Contact with Patient 07/18/13 [phone]     Chief Complaint  Patient presents with  . Back Pain   (Consider location/radiation/quality/duration/timing/severity/associated sxs/prior Treatment) HPI  42 year old female with history of anxiety, depression, GERD, cholelithiasis presents complaining of abdominal pain. Patient states she has been having intermittent pain to her right upper quadrant abdomen which radiates to her back. Pain is described as a sharp and aching sensation, worsening with eating, moving, laying down. Pain does radiates up between the shoulder blade as well. For the past month pain is been getting progressively worse. She endorses nausea and having nonbilious nonbloody vomits and occasional loose stools. Patient has tried over-the-counter medication with minimal improvement. Symptom was especially worse last night the patient was to tired to come to ER. She reports having evaluate for this complaint year prior and was found to have evidence of a large gallstones. She was recommended to followup with a provider, Dr. Loreta Ave however unable to afford the visit. She did mention that she had midsternal chest pain with abdominal pain. Has had a full cardiac workup within the past year including cardiac stress test due to a significant family history of cardiac disease, however states her cardiac workup has been unremarkable.  Past Medical History  Diagnosis Date  . Migraines   . Complication of anesthesia   . PONV (postoperative nausea and vomiting)   . Anxiety   . Depression   . GERD (gastroesophageal reflux disease)   . Chest pain     a. Felt noncardiac with normal stress echo 11/2012.  Marland Kitchen Cholelithiasis     a. on Abd [place] 11/2012.   Past Surgical History  Procedure Laterality Date  . Rotator cuff repair  2010    left  . Tubal ligation  1998  . Cleft lip repair      Multiple  . Cleft palate repair       Multiple   No family history on file. History  Substance Use Topics  . Smoking status: Current Every Day Smoker -- 0.10 packs/day for 1 years    Types: Cigarettes    Start date: 04/10/2012  . Smokeless tobacco: Never Used  . Alcohol Use: No   OB History   Grav Para Term Preterm Abortions TAB SAB Ect Mult Living                 Review of Systems  All other systems reviewed and are negative.    Allergies  Latex; Amoxicillin; Iohexol; and Erythromycin  Home Medications   Current Outpatient Rx  Name  Route  Sig  Dispense  Refill  . omeprazole (PRILOSEC) 20 MG capsule   Oral   Take 1 capsule (20 mg total) by mouth daily.   30 capsule   1    BP 121/53  Pulse 75  Temp(Src) 98.5 F (36.9 C) (Oral)  Resp 20  SpO2 100%  LMP 07/17/2013 Physical Exam  Nursing note and vitals reviewed. Constitutional: She appears well-developed and well-nourished. No distress.  Awake, alert, nontoxic appearance  HENT:  Head: Atraumatic.  Eyes: Conjunctivae are normal. Right eye exhibits no discharge. Left eye exhibits no discharge.  Neck: Neck supple.  Cardiovascular: Normal rate and regular rhythm.   Pulmonary/Chest: Effort normal. No respiratory distress. She exhibits no tenderness.  Abdominal: Soft. There is tenderness (Epigastric and right upper quadrant tenderness. Positive Murphy sign. No McBurney's point.  No guarding,  no rebound tenderness. No overlying skin changes.). There is no rebound.  Genitourinary:  No CVA tenderness.   Musculoskeletal: She exhibits no tenderness.  ROM appears intact, no obvious focal weakness  No significant midline spine tenderness.  Neurological:  Mental status and motor strength appears intact  Skin: No rash noted.  Psychiatric: She has a normal mood and affect.    ED Course  Procedures (including critical care time)  9:58 AM Patient with right upper quadrant abdominal pain highly suggestive of biliary disease. Has had prior Doppler  ultrasound which indicated large gallstones in gallbladder. Will repeat ultrasound today. Pain medication given. Work up initiated.  11:52 AM Intermittent abdominal pain here in ER. Abdominal ultrasound shows evidence of a single large mobile gallstones but no evidence of cholecystitis. She is afebrile, normal vital sign, normal hepatic function, normal lipase, normal white count. However her pain has been recurrent for nearly a year, unable to follow up outpatient.  Plan to consult general surgery for further management. Care discussed with attending.   12:52 PM i have consulted with [facility], and have scheduled and outpt f/u on 07/21/13 @ 3:30 with Dr. Carolynne Edouard.  Pt agrees with plan.  Pt made aware to eat non fatty food to avoid pain.  Return precaution discussed.    Labs Review Labs Reviewed  CBC WITH DIFFERENTIAL - Abnormal; Notable for the following:    Hemoglobin 11.6 (*)    All other components within normal limits  COMPREHENSIVE METABOLIC PANEL - Abnormal; Notable for the following:    Total Bilirubin 0.2 (*)    All other components within normal limits  URINALYSIS, ROUTINE W REFLEX MICROSCOPIC - Abnormal; Notable for the following:    APPearance CLOUDY (*)    Leukocytes, UA SMALL (*)    All other components within normal limits  URINE MICROSCOPIC-ADD ON - Abnormal; Notable for the following:    Squamous Epithelial / LPF FEW (*)    All other components within normal limits  LIPASE, BLOOD   Imaging Review US Abdomen Complete  07/18/2013   *RADIOLOGY REPORT*  Clinical Data:  Abdominal pain, evaluate for biliary disease  COMPLETE ABDOMINAL ULTRASOUND  Comparison:  Prior abdominal ultrasound 12/11/2012  Findings:  Gallbladder:  Mobile 1.6 cm echogenic shadowing focus within the gallbladder consistent with a large gallstone.  No gallbladder wall thickening or pericholecystic fluid.  Per the sonographer, the sonographic Murphy's sign was negative.  Common bile duct:  Within normal limits for caliber  at 2.6 mm.  Liver:  No focal lesion identified.  Within normal limits in parenchymal echogenicity.  IVC:  Appears normal.  Pancreas:  No focal abnormality seen.  Spleen:  Sonographically unremarkable.  Measures 7.9 cm in length.  Right Kidney:  Normal reniform contour without hydronephrosis or nephrolithiasis.  10.1 cm in length.  Left Kidney:  Normal reniform contour without hydronephrosis or nephrolithiasis.  11 cm in length.  Abdominal aorta:  No aneurysm identified.  The distal-most aorta is obscured by bowel gas.  IMPRESSION:  Cholelithiasis without sonographic findings to suggest acute cholecystitis.   Original Report Authenticated By: Malachy Moan, M.D.    MDM   1. Cholelithiasis    BP 120/53  Pulse 57  Temp(Src) 97.9 F (36.6 C) (Oral)  Resp 18  SpO2 100%  LMP 07/17/2013  I have reviewed nursing notes and vital signs. I personally reviewed the imaging tests through PACS system  I reviewed available ER/hospitalization records thought the EMR     Fayrene Helper, [place] 07/18/13 1253

## 2013-07-20 NOTE — ED Provider Notes (Signed)
Medical screening examination/treatment/procedure(s) were performed by non-physician practitioner and as supervising physician I was immediately available for consultation/collaboration.  Jasiyah Paulding T Juliocesar Blasius, MD 07/20/13 2050 

## 2013-07-21 ENCOUNTER — Encounter (INDEPENDENT_AMBULATORY_CARE_PROVIDER_SITE_OTHER): Payer: Self-pay | Admitting: General Surgery

## 2013-07-21 ENCOUNTER — Encounter (INDEPENDENT_AMBULATORY_CARE_PROVIDER_SITE_OTHER): Payer: Self-pay

## 2013-07-21 ENCOUNTER — Ambulatory Visit (INDEPENDENT_AMBULATORY_CARE_PROVIDER_SITE_OTHER): Payer: Self-pay | Admitting: General Surgery

## 2013-07-21 VITALS — BP 136/96 | HR 84 | Temp 97.4°F | Ht 62.0 in | Wt 152.8 lb

## 2013-07-21 DIAGNOSIS — K8020 Calculus of gallbladder without cholecystitis without obstruction: Secondary | ICD-10-CM

## 2013-07-21 NOTE — Patient Instructions (Signed)
Plan for lap chole

## 2013-07-21 NOTE — Progress Notes (Signed)
Patient ID: [name], female   DOB: 11/30/1970, 42 y.o.   MRN: [id number]  Chief Complaint  Patient presents with  . New Evaluation    eval gallbladder    HPI [name] is a 42 y.o. female.  We're asked to see the patient in consultation by Dr. Allen to evaluate her for gallstones. The patient is a 42-year-old white female who began having epigastric pain earlier this year. The pain radiated into her back. The pain has been severe at times. The pain has been associated with nausea and vomiting. Over the last month she has been experiencing pain every day. She has lost about 5 pounds over the last 2 weeks. As part of her workup she had an ultrasound which did show a large stone in her gallbladder. There was no gallbladder wall thickening or ductal dilatation. Her liver functions were normal.  HPI  Past Medical History  Diagnosis Date  . Migraines   . Complication of anesthesia   . PONV (postoperative nausea and vomiting)   . Anxiety   . Depression   . GERD (gastroesophageal reflux disease)   . Chest pain     a. Felt noncardiac with normal stress echo 11/2012.  . Cholelithiasis     a. on Abd US 11/2012.    Past Surgical History  Procedure Laterality Date  . Rotator cuff repair  2010    left  . Tubal ligation  1998  . Cleft lip repair      Multiple  . Cleft palate repair      Multiple    History reviewed. No pertinent family history.  Social History History  Substance Use Topics  . Smoking status: Current Every Day Smoker -- 0.10 packs/day for 1 years    Types: Cigarettes    Start date: 04/10/2012  . Smokeless tobacco: Never Used  . Alcohol Use: No    Allergies  Allergen Reactions  . Latex Hives, Itching and Rash  . Amoxicillin Nausea Only  . Iohexol      Desc: Pt had throat tightness, slight swelling. Itchy eyes and itchy gums.  Dr Mansell spoke w/ pt.  Treating this like possible/early reaction.  Pt was counseled. Needs full premeds w/ IV contrast, txs.,  Onset Date: [id number]   . Erythromycin Rash    Current Outpatient Prescriptions  Medication Sig Dispense Refill  . Aspirin-Acetaminophen-Caffeine (GOODY HEADACHE PO) Take 1 packet by mouth every 4 (four) hours as needed (for pain).      . HYDROcodone-acetaminophen (NORCO/VICODIN) 5-325 MG per tablet Take 1 tablet by mouth every 4 (four) hours as needed for pain.  20 tablet  0  . promethazine (PHENERGAN) 25 MG tablet Take 1 tablet (25 mg total) by mouth every 6 (six) hours as needed for nausea.  20 tablet  0   No current facility-administered medications for this visit.    Review of Systems Review of Systems  Constitutional: Positive for unexpected weight change.  HENT: Negative.   Eyes: Negative.   Respiratory: Negative.   Cardiovascular: Negative.   Gastrointestinal: Positive for nausea, vomiting and abdominal pain.  Endocrine: Negative.   Genitourinary: Negative.   Musculoskeletal: Negative.   Skin: Negative.   Allergic/Immunologic: Negative.   Neurological: Negative.   Hematological: Negative.   Psychiatric/Behavioral: Negative.     Blood pressure 136/96, pulse 84, temperature 97.4 F (36.3 C), temperature source Temporal, height 5' 2" (1.575 m), weight 152 lb 12.8 oz (69.31 kg), last menstrual period 07/17/2013, SpO2 99.00%.    Physical Exam Physical Exam  Constitutional: She is oriented to person, place, and time. She appears well-developed and well-nourished.  HENT:  Head: Normocephalic and atraumatic.  Eyes: Conjunctivae and EOM are normal. Pupils are equal, round, and reactive to light.  Neck: Normal range of motion. Neck supple.  Cardiovascular: Normal rate, regular rhythm and normal heart sounds.   Pulmonary/Chest: Effort normal and breath sounds normal.  Abdominal: Soft. Bowel sounds are normal. She exhibits no mass. There is tenderness.  Musculoskeletal: Normal range of motion.  Neurological: She is alert and oriented to person, place, and time.  Skin: Skin is  warm and dry.  Psychiatric: She has a normal mood and affect. Her behavior is normal.    Data Reviewed As above  Assessment    The patient appears to have symptomatic gallstones. Because of the risk of further painful episodes and possible pancreatitis I think she would benefit from having her gallbladder removed. I've discussed with her in detail the risks and benefits the operation to remove the gallbladder as well as some of the technical aspects and she understands and wishes to proceed     Plan    Plan for laparoscopic cholecystectomy. She does have a contrast allergy so we will try to avoid a cholangiogram unless it is absolutely necessary        TOTH III,Reise Gladney S 07/21/2013, 3:48 PM    

## 2013-07-23 ENCOUNTER — Telehealth (INDEPENDENT_AMBULATORY_CARE_PROVIDER_SITE_OTHER): Payer: Self-pay

## 2013-07-23 DIAGNOSIS — K8020 Calculus of gallbladder without cholecystitis without obstruction: Secondary | ICD-10-CM

## 2013-07-23 MED ORDER — HYDROCODONE-ACETAMINOPHEN 5-325 MG PO TABS: 1.0000 | ORAL_TABLET | ORAL | Status: DC | PRN

## 2013-07-23 NOTE — Addendum Note (Signed)
Addended byIvory Broad on: 07/23/2013 10:48 AM   Modules accepted: Orders

## 2013-07-23 NOTE — Telephone Encounter (Signed)
Per Dr Carolynne Edouard I could call in #10 no refill.  I notified the pt and I called in Hydrocodone 5/325 take one po q 4 prn #10 no refill to [facility] in [place] on [address].  I advised pt to try to make them last.  I also let her know it was Denny Peon in scheduling that called her I will see if she can pick up the call.  The pt is waiting to schedule surgery.  I called Denny Peon but she is with a pt.  She will call her back.

## 2013-07-23 NOTE — Telephone Encounter (Signed)
The pt called in requesting preop Hydrocodone.  She is waiting to be scheduled for surgery.  I told her typically the surgeons don't prescribe medicine preop but I will ask Dr Carolynne Edouard.

## 2013-07-23 NOTE — Telephone Encounter (Signed)
She uses the [facility] in [place] on [address]

## 2013-07-26 ENCOUNTER — Other Ambulatory Visit (INDEPENDENT_AMBULATORY_CARE_PROVIDER_SITE_OTHER): Payer: Self-pay | Admitting: General Surgery

## 2013-07-28 ENCOUNTER — Telehealth (INDEPENDENT_AMBULATORY_CARE_PROVIDER_SITE_OTHER): Payer: Self-pay

## 2013-07-28 ENCOUNTER — Other Ambulatory Visit (INDEPENDENT_AMBULATORY_CARE_PROVIDER_SITE_OTHER): Payer: Self-pay

## 2013-07-28 DIAGNOSIS — K8020 Calculus of gallbladder without cholecystitis without obstruction: Secondary | ICD-10-CM

## 2013-07-28 MED ORDER — HYDROCODONE-ACETAMINOPHEN 5-325 MG PO TABS: 1.0000 | ORAL_TABLET | ORAL | Status: DC | PRN

## 2013-07-28 NOTE — Telephone Encounter (Signed)
Called and spoke to pharmacy to refill Hydrocodone 5/325mg , 1 po q4hrs prn pain, #20, with 0 refills per Dr. Carolynne Edouard.  Patient advised of refill approval.

## 2013-07-29 ENCOUNTER — Telehealth (INDEPENDENT_AMBULATORY_CARE_PROVIDER_SITE_OTHER): Payer: Self-pay

## 2013-07-29 DIAGNOSIS — R112 Nausea with vomiting, unspecified: Secondary | ICD-10-CM

## 2013-07-29 NOTE — Telephone Encounter (Signed)
Pt called stating she did get her refill for hydrocodone but forgot to ask for her phenergan tablets to be refilled. Pt states the phenergan  really helps her nausea. No fever. Voiding well. BMs normal. Pt has gallbladder surgery scheduled. Pt advised I will send this request to Dr Carolynne Edouard for review.Pt aware Dr Carolynne Edouard is off this afternoon.  Pt can be reached at [phone] and pharmacy is [facility] [phone].

## 2013-07-29 NOTE — Telephone Encounter (Signed)
Unable to reach pt at # given to see if she is out of her medication. Phenergan request printed and routed to Dr Biagio Quint in urgent office for review.

## 2013-07-31 ENCOUNTER — Encounter (HOSPITAL_COMMUNITY): Payer: Self-pay

## 2013-07-31 MED ORDER — PROMETHAZINE HCL 25 MG PO TABS: 12.5000 mg | ORAL_TABLET | Freq: Four times a day (QID) | ORAL | Status: DC | PRN

## 2013-07-31 NOTE — Addendum Note (Signed)
Addended by: Brennan Bailey on: 07/31/2013 10:18 AM   Modules accepted: Orders

## 2013-07-31 NOTE — Telephone Encounter (Signed)
LMOM> phenergan refill okayed. Sent to pharmacy.

## 2013-07-31 NOTE — Telephone Encounter (Signed)
She can have phenergan 12.5mg , 10 pills no refill, 1 po q6hr prn

## 2013-08-04 ENCOUNTER — Encounter (HOSPITAL_COMMUNITY): Payer: Self-pay | Admitting: Pharmacy Technician

## 2013-08-04 ENCOUNTER — Encounter (HOSPITAL_COMMUNITY): Payer: Self-pay

## 2013-08-04 ENCOUNTER — Encounter (HOSPITAL_COMMUNITY)
Admission: RE | Admit: 2013-08-04 | Discharge: 2013-08-04 | Disposition: A | Discharge status: Home / Self Care | Payer: Self-pay | Source: Ambulatory Visit | Attending: General Surgery | Admitting: General Surgery

## 2013-08-04 DIAGNOSIS — Z01812 Encounter for preprocedural laboratory examination: Secondary | ICD-10-CM | POA: Insufficient documentation

## 2013-08-04 DIAGNOSIS — Z01818 Encounter for other preprocedural examination: Secondary | ICD-10-CM | POA: Insufficient documentation

## 2013-08-04 HISTORY — DX: Anemia, unspecified: D64.9

## 2013-08-04 LAB — CBC
HCT: 36.2 % (ref 36.0–46.0)
MCH: 27.3 pg (ref 26.0–34.0)
MCHC: 33.4 g/dL (ref 30.0–36.0)
MCV: 81.7 fL (ref 78.0–100.0)
Platelets: 242 10*3/uL (ref 150–400)
RBC: 4.43 MIL/uL (ref 3.87–5.11)

## 2013-08-04 LAB — HCG, SERUM, QUALITATIVE: Preg, Serum: NEGATIVE

## 2013-08-04 LAB — BASIC METABOLIC PANEL
BUN: 19 mg/dL (ref 6–23)
CO2: 24 mEq/L (ref 19–32)
Calcium: 9 mg/dL (ref 8.4–10.5)
Chloride: 104 mEq/L (ref 96–112)
GFR calc non Af Amer: >90 mL/min (ref >90)
Glucose, Bld: 78 mg/dL (ref 70–99)
Sodium: 138 mEq/L (ref 135–145)

## 2013-08-04 NOTE — Pre-Procedure Instructions (Signed)
Shaqueena S Mault  08/04/2013   Your procedure is scheduled on:  08-07-2013  Thursday   Report to [facility]  2 * 3 at 9:30 AM.   Call this number if you have problems the morning of surgery: [phone]   Remember:   Do not eat food or drink liquids after midnight.    Take these medicines the morning of surgery with A SIP OF WATER: pain medication as needed   Do not wear jewelry, make-up or nail polish.  Do not wear lotions, powders, or perfumes.   Do not shave 48 hours prior to surgery.   Do not bring valuables to the hospital.  [facility] is not responsible for any belongings or valuables.  Contacts, dentures or bridgework may not be worn into surgery.  Leave suitcase in the car. After surgery it may be brought to your room.   For patients admitted to the hospital, checkout time is 11:00 AM the day of discharge.   Patients discharged the day of surgery will not be allowed to drive home.    Name and phone number of your driver:    Special Instructions: Shower using CHG 2 nights before surgery and the night before surgery.  If you shower the day of surgery use CHG.  Use special wash - you have one bottle of CHG for all showers.  You should use approximately 1/3 of the bottle for each shower.   Please read over the following fact sheets that you were given: Pain Booklet, Coughing and Deep Breathing and Surgical Site Infection Prevention

## 2013-08-06 MED ORDER — CIPROFLOXACIN IN D5W 400 MG/200ML IV SOLN
400.0000 mg | INTRAVENOUS | Status: AC
  Administered 2013-08-07: 400 mg via INTRAVENOUS
  Filled 2013-08-06: qty 200

## 2013-08-07 ENCOUNTER — Ambulatory Visit (HOSPITAL_COMMUNITY)
Admission: RE | Admit: 2013-08-07 | Discharge: 2013-08-07 | Disposition: A | Discharge status: Home / Self Care | Payer: Self-pay | Source: Ambulatory Visit | Attending: General Surgery | Admitting: General Surgery

## 2013-08-07 ENCOUNTER — Other Ambulatory Visit (INDEPENDENT_AMBULATORY_CARE_PROVIDER_SITE_OTHER): Payer: Self-pay | Admitting: General Surgery

## 2013-08-07 ENCOUNTER — Encounter (HOSPITAL_COMMUNITY)
Admission: RE | Disposition: A | Discharge status: Home / Self Care | Payer: Self-pay | Source: Ambulatory Visit | Attending: General Surgery

## 2013-08-07 ENCOUNTER — Ambulatory Visit (HOSPITAL_COMMUNITY): Payer: MEDICAID | Admitting: Anesthesiology

## 2013-08-07 ENCOUNTER — Encounter (HOSPITAL_COMMUNITY): Payer: Self-pay | Admitting: *Deleted

## 2013-08-07 ENCOUNTER — Encounter (HOSPITAL_COMMUNITY): Payer: Self-pay | Admitting: Anesthesiology

## 2013-08-07 DIAGNOSIS — K8020 Calculus of gallbladder without cholecystitis without obstruction: Secondary | ICD-10-CM | POA: Insufficient documentation

## 2013-08-07 DIAGNOSIS — K824 Cholesterolosis of gallbladder: Secondary | ICD-10-CM

## 2013-08-07 DIAGNOSIS — K8010 Calculus of gallbladder with chronic cholecystitis without obstruction: Secondary | ICD-10-CM

## 2013-08-07 HISTORY — PX: CHOLECYSTECTOMY: SHX55

## 2013-08-07 SURGERY — LAPAROSCOPIC CHOLECYSTECTOMY
Anesthesia: General | Wound class: Clean Contaminated

## 2013-08-07 MED ORDER — LIDOCAINE HCL (CARDIAC) 20 MG/ML IV SOLN
INTRAVENOUS | Status: DC | PRN
  Administered 2013-08-07: 60 mg via INTRAVENOUS

## 2013-08-07 MED ORDER — BUPIVACAINE-EPINEPHRINE 0.25% -1:200000 IJ SOLN
INTRAMUSCULAR | Status: DC | PRN
  Administered 2013-08-07: 19 mL

## 2013-08-07 MED ORDER — FENTANYL CITRATE 0.05 MG/ML IJ SOLN
INTRAMUSCULAR | Status: DC | PRN
  Administered 2013-08-07: 50 ug via INTRAVENOUS

## 2013-08-07 MED ORDER — ARTIFICIAL TEARS OP OINT
TOPICAL_OINTMENT | OPHTHALMIC | Status: DC | PRN
  Administered 2013-08-07: 1 via OPHTHALMIC

## 2013-08-07 MED ORDER — SCOPOLAMINE 1 MG/3DAYS TD PT72
MEDICATED_PATCH | TRANSDERMAL | Status: AC
  Filled 2013-08-07: qty 1

## 2013-08-07 MED ORDER — PROPOFOL 10 MG/ML IV BOLUS
INTRAVENOUS | Status: DC | PRN
  Administered 2013-08-07: 200 mg via INTRAVENOUS

## 2013-08-07 MED ORDER — ONDANSETRON HCL 4 MG/2ML IJ SOLN
INTRAMUSCULAR | Status: DC | PRN
  Administered 2013-08-07: 4 mg via INTRAVENOUS

## 2013-08-07 MED ORDER — HYDROCODONE-ACETAMINOPHEN 5-325 MG PO TABS: 1.0000 | ORAL_TABLET | ORAL | Status: DC | PRN

## 2013-08-07 MED ORDER — HYDROMORPHONE HCL PF 1 MG/ML IJ SOLN
INTRAMUSCULAR | Status: AC
  Administered 2013-08-07: 0.5 mg via INTRAVENOUS
  Filled 2013-08-07: qty 1

## 2013-08-07 MED ORDER — SCOPOLAMINE 1 MG/3DAYS TD PT72
1.0000 | MEDICATED_PATCH | TRANSDERMAL | Status: DC
  Administered 2013-08-07: 1.5 mg via TRANSDERMAL

## 2013-08-07 MED ORDER — SODIUM CHLORIDE 0.9 % IR SOLN
Status: DC | PRN
  Administered 2013-08-07: 1000 mL

## 2013-08-07 MED ORDER — ONDANSETRON HCL 4 MG/2ML IJ SOLN
INTRAMUSCULAR | Status: AC
  Filled 2013-08-07: qty 2

## 2013-08-07 MED ORDER — LACTATED RINGERS IV SOLN
INTRAVENOUS | Status: DC | PRN
  Administered 2013-08-07: 11:00:00 via INTRAVENOUS

## 2013-08-07 MED ORDER — ONDANSETRON HCL 4 MG/2ML IJ SOLN
4.0000 mg | Freq: Once | INTRAMUSCULAR | Status: AC | PRN
  Administered 2013-08-07: 4 mg via INTRAVENOUS

## 2013-08-07 MED ORDER — HYDROMORPHONE HCL PF 1 MG/ML IJ SOLN
0.2500 mg | INTRAMUSCULAR | Status: DC | PRN
  Administered 2013-08-07 (×4): 0.5 mg via INTRAVENOUS

## 2013-08-07 MED ORDER — NEOSTIGMINE METHYLSULFATE 1 MG/ML IJ SOLN
INTRAMUSCULAR | Status: DC | PRN
  Administered 2013-08-07: 3 mg via INTRAVENOUS

## 2013-08-07 MED ORDER — 0.9 % SODIUM CHLORIDE (POUR BTL) OPTIME
TOPICAL | Status: DC | PRN
  Administered 2013-08-07: 1000 mL

## 2013-08-07 MED ORDER — ROCURONIUM BROMIDE 100 MG/10ML IV SOLN
INTRAVENOUS | Status: DC | PRN
  Administered 2013-08-07: 25 mg via INTRAVENOUS

## 2013-08-07 MED ORDER — DEXAMETHASONE SODIUM PHOSPHATE 10 MG/ML IJ SOLN
INTRAMUSCULAR | Status: DC | PRN
  Administered 2013-08-07: 4 mg via INTRAVENOUS

## 2013-08-07 MED ORDER — CHLORHEXIDINE GLUCONATE 4 % EX LIQD: 1.0000 "application " | Freq: Once | CUTANEOUS | Status: DC

## 2013-08-07 MED ORDER — MIDAZOLAM HCL 5 MG/5ML IJ SOLN
INTRAMUSCULAR | Status: DC | PRN
  Administered 2013-08-07: 2 mg via INTRAVENOUS

## 2013-08-07 MED ORDER — GLYCOPYRROLATE 0.2 MG/ML IJ SOLN
INTRAMUSCULAR | Status: DC | PRN
  Administered 2013-08-07: 0.4 mg via INTRAVENOUS

## 2013-08-07 SURGICAL SUPPLY — 39 items
ADH SKN CLS APL DERMABOND .7 (GAUZE/BANDAGES/DRESSINGS) ×1
APPLIER CLIP ROT 10 11.4 M/L (STAPLE) ×2
APR CLP MED LRG 11.4X10 (STAPLE) ×1
BAG SPEC RTRVL LRG 6X4 10 (ENDOMECHANICALS) ×1
BLADE SURG ROTATE 9660 (MISCELLANEOUS) IMPLANT
CANISTER SUCTION 2500CC (MISCELLANEOUS) ×2 IMPLANT
CHLORAPREP W/TINT 26ML (MISCELLANEOUS) ×2 IMPLANT
CLIP APPLIE ROT 10 11.4 M/L (STAPLE) ×1 IMPLANT
CLOTH BEACON ORANGE TIMEOUT ST (SAFETY) ×2 IMPLANT
COVER SURGICAL LIGHT HANDLE (MISCELLANEOUS) ×2 IMPLANT
DECANTER SPIKE VIAL GLASS SM (MISCELLANEOUS) ×2 IMPLANT
DERMABOND ADVANCED (GAUZE/BANDAGES/DRESSINGS) ×1
DERMABOND ADVANCED .7 DNX12 (GAUZE/BANDAGES/DRESSINGS) ×1 IMPLANT
DRAPE UTILITY 15X26 W/TAPE STR (DRAPE) ×4 IMPLANT
ELECT REM PT RETURN 9FT ADLT (ELECTROSURGICAL) ×2
ELECTRODE REM PT RTRN 9FT ADLT (ELECTROSURGICAL) ×1 IMPLANT
GLOVE BIOGEL PI IND STRL 7.0 (GLOVE) ×2 IMPLANT
GLOVE BIOGEL PI INDICATOR 7.0 (GLOVE) ×2
GLOVE SURG SS PI 6.5 STRL IVOR (GLOVE) ×2 IMPLANT
GLOVE SURG SS PI 7.0 STRL IVOR (GLOVE) ×4 IMPLANT
GLOVE SURG SS PI 7.5 STRL IVOR (GLOVE) ×4 IMPLANT
GOWN STRL NON-REIN LRG LVL3 (GOWN DISPOSABLE) ×10 IMPLANT
KIT BASIN OR (CUSTOM PROCEDURE TRAY) ×2 IMPLANT
KIT ROOM TURNOVER OR (KITS) ×2 IMPLANT
NS IRRIG 1000ML POUR BTL (IV SOLUTION) ×2 IMPLANT
PAD ARMBOARD 7.5X6 YLW CONV (MISCELLANEOUS) ×4 IMPLANT
POUCH SPECIMEN RETRIEVAL 10MM (ENDOMECHANICALS) ×2 IMPLANT
SCISSORS LAP 5X35 DISP (ENDOMECHANICALS) IMPLANT
SET IRRIG TUBING LAPAROSCOPIC (IRRIGATION / IRRIGATOR) ×2 IMPLANT
SLEEVE ENDOPATH XCEL 5M (ENDOMECHANICALS) ×2 IMPLANT
SPECIMEN JAR SMALL (MISCELLANEOUS) ×2 IMPLANT
SUT MNCRL AB 4-0 PS2 18 (SUTURE) ×2 IMPLANT
TOWEL OR 17X24 6PK STRL BLUE (TOWEL DISPOSABLE) ×2 IMPLANT
TOWEL OR 17X26 10 PK STRL BLUE (TOWEL DISPOSABLE) ×2 IMPLANT
TRAY LAPAROSCOPIC (CUSTOM PROCEDURE TRAY) ×2 IMPLANT
TROCAR XCEL BLUNT TIP 100MML (ENDOMECHANICALS) ×2 IMPLANT
TROCAR XCEL NON-BLD 11X100MML (ENDOMECHANICALS) ×2 IMPLANT
TROCAR XCEL NON-BLD 5MMX100MML (ENDOMECHANICALS) ×2 IMPLANT
WATER STERILE IRR 1000ML POUR (IV SOLUTION) IMPLANT

## 2013-08-07 NOTE — Transfer of Care (Signed)
Immediate Anesthesia Transfer of Care Note  Patient: [name]  Procedure(s) Performed: Procedure(s): LAPAROSCOPIC CHOLECYSTECTOMY (N/A)  Patient Location: PACU  Anesthesia Type:General  Level of Consciousness: awake, alert , oriented and sedated  Airway & Oxygen Therapy: Patient Spontanous Breathing and Patient connected to nasal cannula oxygen  Post-op Assessment: Report given to PACU RN, Post -op Vital signs reviewed and stable and Patient moving all extremities  Post vital signs: Reviewed and stable  Complications: No apparent anesthesia complications

## 2013-08-07 NOTE — Preoperative (Signed)
Beta Blockers   Reason not to administer Beta Blockers:Not Applicable 

## 2013-08-07 NOTE — Op Note (Signed)
08/07/2013  12:08 PM  PATIENT:  [name]  42 y.o. female  PRE-OPERATIVE DIAGNOSIS:  Cholelithiasis  POST-OPERATIVE DIAGNOSIS:  Cholelithiasis  PROCEDURE:  Procedure(s): LAPAROSCOPIC CHOLECYSTECTOMY (N/A)  SURGEON:  Surgeon(s) and Role:    * Robyne Askew, MD - Primary    * Adolph Pollack, MD - Assisting  PHYSICIAN ASSISTANT:   ASSISTANTS: Dr. Abbey Chatters   ANESTHESIA:   general  EBL:  Total I/O In: 1000 [I.V.:1000] Out: 5 [Blood:5]  BLOOD ADMINISTERED:none  DRAINS: none   LOCAL MEDICATIONS USED:  MARCAINE     SPECIMEN:  Source of Specimen:  gallbladder  DISPOSITION OF SPECIMEN:  PATHOLOGY  COUNTS:  YES  TOURNIQUET:  * No tourniquets in log *  DICTATION: .Dragon Dictation  Procedure: After informed consent was obtained the patient was brought to the operating room and placed in the supine position on the operating room table. After adequate induction of general anesthesia the patient's abdomen was prepped with ChloraPrep allowed to dry and draped in usual sterile manner. The area below the umbilicus was infiltrated with quarter percent  Marcaine. A small incision was made with a 15 blade knife. The incision was carried down through the subcutaneous tissue bluntly with a hemostat and [employer] retractors. The linea alba was identified. The linea alba was incised with a 15 blade knife and each side was grasped with Coker clamps. The preperitoneal space was then probed with a hemostat until the peritoneum was opened and access was gained to the abdominal cavity. A 0 Vicryl pursestring stitch was placed in the fascia surrounding the opening. A Hassan cannula was then placed through the opening and anchored in place with the previously placed Vicryl purse string stitch. The abdomen was insufflated with carbon dioxide without difficulty. A laparoscope was inserted through the [facility] cannula in the right upper quadrant was inspected. Next the epigastric region was  infiltrated with % Marcaine. A small incision was made with a 15 blade knife. A 10 mm port was placed bluntly through this incision into the abdominal cavity under direct vision. Next 2 sites were chosen laterally on the right side of the abdomen for placement of 5 mm ports. Each of these areas was infiltrated with quarter percent Marcaine. Small stab incisions were made with a 15 blade knife. 5 mm ports were then placed bluntly through these incisions into the abdominal cavity under direct vision without difficulty. A blunt grasper was placed through the lateralmost 5 mm port and used to grasp the dome of the gallbladder and elevated anteriorly and superiorly. Another blunt grasper was placed through the other 5 mm port and used to retract the body and neck of the gallbladder. A dissector was placed through the epigastric port and using the electrocautery the peritoneal reflection at the gallbladder neck was opened. Blunt dissection was then carried out in this area until the gallbladder neck-cystic duct junction was readily identified and a good window was created. A single clip was placed on the gallbladder neck. 3 clips were placed proximally on the cystic duct and the duct was divided between the 2 sets of clips. Posterior to this the cystic artery was identified and again dissected bluntly in a circumferential manner until a good window  was created. 2 clips were placed proximally and one distally on the artery and the artery was divided between the 2 sets of clips. Next a laparoscopic hook cautery device was used to separate the gallbladder from the liver bed. Prior to completely detaching  the gallbladder from the liver bed the liver bed was inspected and several small bleeding points were coagulated with the electrocautery until the area was completely hemostatic. The gallbladder was then detached the rest of it from the liver bed without difficulty. A laparoscopic bag was inserted through the epigastric  port. The gallbladder was placed within the bag and the bag was sealed. A laparoscope was then moved to the epigastric port. The gallbladder grasper was placed through the [facility] cannula and used to grasp the opening of the bag. The bag with the gallbladder was then removed with the [facility] cannula through the infraumbilical port without difficulty. The fascial defect was then closed with the previously placed Vicryl pursestring stitch as well as with another figure-of-eight 0 Vicryl stitch. The liver bed was inspected again and found to be hemostatic. The abdomen was irrigated with copious amounts of saline until the effluent was clear. The ports were then removed under direct vision without difficulty and were found to be hemostatic. The gas was allowed to escape. The skin incisions were all closed with interrupted 4-0 Monocryl subcuticular stitches. Dermabond dressings were applied. The patient tolerated the procedure well. At the end of the case all needle sponge and instrument counts were correct. The patient was then awakened and taken to recovery in stable condition  PLAN OF CARE: Discharge to home after PACU  PATIENT DISPOSITION:  PACU - hemodynamically stable.   Delay start of Pharmacological VTE agent (>24hrs) due to surgical blood loss or risk of bleeding: not applicable

## 2013-08-07 NOTE — H&P (View-Only) (Signed)
Patient ID: [name], female   DOB: 1971/06/17, 42 y.o.   MRN: [id number]  Chief Complaint  Patient presents with  . New Evaluation    eval gallbladder    HPI [name] is a 42 y.o. female.  We're asked to see the patient in consultation by Dr. Freida Busman to evaluate her for gallstones. The patient is a 42 year old white female who began having epigastric pain earlier this year. The pain radiated into her back. The pain has been severe at times. The pain has been associated with nausea and vomiting. Over the last month she has been experiencing pain every day. She has lost about 5 pounds over the last 2 weeks. As part of her workup she had an ultrasound which did show a large stone in her gallbladder. There was no gallbladder wall thickening or ductal dilatation. Her liver functions were normal.  HPI  Past Medical History  Diagnosis Date  . Migraines   . Complication of anesthesia   . PONV (postoperative nausea and vomiting)   . Anxiety   . Depression   . GERD (gastroesophageal reflux disease)   . Chest pain     a. Felt noncardiac with normal stress echo 11/2012.  Marland Kitchen Cholelithiasis     a. on Abd [place] 11/2012.    Past Surgical History  Procedure Laterality Date  . Rotator cuff repair  2010    left  . Tubal ligation  1998  . Cleft lip repair      Multiple  . Cleft palate repair      Multiple    History reviewed. No pertinent family history.  Social History History  Substance Use Topics  . Smoking status: Current Every Day Smoker -- 0.10 packs/day for 1 years    Types: Cigarettes    Start date: 04/10/2012  . Smokeless tobacco: Never Used  . Alcohol Use: No    Allergies  Allergen Reactions  . Latex Hives, Itching and Rash  . Amoxicillin Nausea Only  . Iohexol      Desc: Pt had throat tightness, slight swelling. Itchy eyes and itchy gums.  Dr Molli Posey spoke w/ pt.  Treating this like possible/early reaction.  Pt was counseled. Needs full premeds w/ IV contrast, txs.,  Onset Date: [id number]   . Erythromycin Rash    Current Outpatient Prescriptions  Medication Sig Dispense Refill  . Aspirin-Acetaminophen-Caffeine (GOODY HEADACHE PO) Take 1 packet by mouth every 4 (four) hours as needed (for pain).      Marland Kitchen HYDROcodone-acetaminophen (NORCO/VICODIN) 5-325 MG per tablet Take 1 tablet by mouth every 4 (four) hours as needed for pain.  20 tablet  0  . promethazine (PHENERGAN) 25 MG tablet Take 1 tablet (25 mg total) by mouth every 6 (six) hours as needed for nausea.  20 tablet  0   No current facility-administered medications for this visit.    Review of Systems Review of Systems  Constitutional: Positive for unexpected weight change.  HENT: Negative.   Eyes: Negative.   Respiratory: Negative.   Cardiovascular: Negative.   Gastrointestinal: Positive for nausea, vomiting and abdominal pain.  Endocrine: Negative.   Genitourinary: Negative.   Musculoskeletal: Negative.   Skin: Negative.   Allergic/Immunologic: Negative.   Neurological: Negative.   Hematological: Negative.   Psychiatric/Behavioral: Negative.     Blood pressure 136/96, pulse 84, temperature 97.4 F (36.3 C), temperature source Temporal, height 5\' 2"  (1.575 m), weight 152 lb 12.8 oz (69.31 kg), last menstrual period 07/17/2013, SpO2 99.00%.  Physical Exam Physical Exam  Constitutional: She is oriented to person, place, and time. She appears well-developed and well-nourished.  HENT:  Head: Normocephalic and atraumatic.  Eyes: Conjunctivae and EOM are normal. Pupils are equal, round, and reactive to light.  Neck: Normal range of motion. Neck supple.  Cardiovascular: Normal rate, regular rhythm and normal heart sounds.   Pulmonary/Chest: Effort normal and breath sounds normal.  Abdominal: Soft. Bowel sounds are normal. She exhibits no mass. There is tenderness.  Musculoskeletal: Normal range of motion.  Neurological: She is alert and oriented to person, place, and time.  Skin: Skin is  warm and dry.  Psychiatric: She has a normal mood and affect. Her behavior is normal.    Data Reviewed As above  Assessment    The patient appears to have symptomatic gallstones. Because of the risk of further painful episodes and possible pancreatitis I think she would benefit from having her gallbladder removed. I've discussed with her in detail the risks and benefits the operation to remove the gallbladder as well as some of the technical aspects and she understands and wishes to proceed     Plan    Plan for laparoscopic cholecystectomy. She does have a contrast allergy so we will try to avoid a cholangiogram unless it is absolutely necessary        TOTH III,PAUL S 07/21/2013, 3:48 PM

## 2013-08-07 NOTE — Interval H&P Note (Signed)
History and Physical Interval Note:  08/07/2013 11:02 AM  [name]  has presented today for surgery, with the diagnosis of Cholelithiasis  The various methods of treatment have been discussed with the patient and family. After consideration of risks, benefits and other options for treatment, the patient has consented to  Procedure(s): LAPAROSCOPIC CHOLECYSTECTOMY possible IOC  (N/A) as a surgical intervention .  The patient's history has been reviewed, patient examined, no change in status, stable for surgery.  I have reviewed the patient's chart and labs.  Questions were answered to the patient's satisfaction.     TOTH III,PAUL S

## 2013-08-07 NOTE — Anesthesia Preprocedure Evaluation (Signed)
Anesthesia Evaluation  Patient identified by MRN, date of birth, ID band Patient awake    Reviewed: Allergy & Precautions, H&P , NPO status , Patient's Chart, lab work & pertinent test results  History of Anesthesia Complications (+) PONV  Airway Mallampati: I TM Distance: >3 FB Neck ROM: full    Dental   Pulmonary          Cardiovascular Rhythm:regular Rate:Normal     Neuro/Psych  Headaches,    GI/Hepatic GERD-  ,  Endo/Other    Renal/GU      Musculoskeletal   Abdominal   Peds  Hematology   Anesthesia Other Findings   Reproductive/Obstetrics                           Anesthesia Physical Anesthesia Plan  ASA: I  Anesthesia Plan: General   Post-op Pain Management:    Induction: Intravenous  Airway Management Planned: Oral ETT  Additional Equipment:   Intra-op Plan:   Post-operative Plan: Extubation in OR  Informed Consent: I have reviewed the patients History and Physical, chart, labs and discussed the procedure including the risks, benefits and alternatives for the proposed anesthesia with the patient or authorized representative who has indicated his/her understanding and acceptance.     Plan Discussed with: CRNA, Anesthesiologist and Surgeon  Anesthesia Plan Comments:         Anesthesia Quick Evaluation

## 2013-08-08 ENCOUNTER — Telehealth (INDEPENDENT_AMBULATORY_CARE_PROVIDER_SITE_OTHER): Payer: Self-pay

## 2013-08-08 ENCOUNTER — Telehealth (INDEPENDENT_AMBULATORY_CARE_PROVIDER_SITE_OTHER): Payer: Self-pay | Admitting: *Deleted

## 2013-08-08 NOTE — Anesthesia Postprocedure Evaluation (Signed)
  Anesthesia Post-op Note  Patient: [name]  Procedure(s) Performed: Procedure(s): LAPAROSCOPIC CHOLECYSTECTOMY (N/A)  Patient Location: PACU  Anesthesia Type:General  Level of Consciousness: awake, alert , oriented and patient cooperative  Airway and Oxygen Therapy: Patient Spontanous Breathing  Post-op Pain: mild  Post-op Assessment: Post-op Vital signs reviewed, Patient's Cardiovascular Status Stable, Respiratory Function Stable, Patent Airway, No signs of Nausea or vomiting and Pain level controlled  Post-op Vital Signs: stable  Complications: No apparent anesthesia complications

## 2013-08-08 NOTE — Telephone Encounter (Signed)
Received a request from the pharmacy for a refill of Phenergan for the patient.  Patient had Lap Cholecystectomy on 08/07/13.  Patient received Phenergan #10 on 07/31/13 due to nausea/vomiting prior to surgery.  Patient still having some nausea following surgery.  Patient does not currently have PO appt setup with Dr. Carolynne Edouard.  Message being sent to Dr. Carolynne Edouard to ask for permission to refill or appropriate plan for patient.  Awaiting response at this time.

## 2013-08-08 NOTE — Telephone Encounter (Signed)
Spoke to Dr. Carolynne Edouard who approved refill for Phenergan #10 at this time.  Prescription escribed back to requesting pharmacy and confirmation received.

## 2013-08-08 NOTE — Telephone Encounter (Signed)
Message copied by Brennan Bailey on Fri Aug 08, 2013  2:30 PM ------      Message from: Mervin Kung      Created: Fri Aug 08, 2013 10:28 AM      Contact: [phone]       Marcelino Duster, pt called she wants to know when can she start physical fitness again?  When can she return to work? We can call her at the number listed she states if no answer please leave her a detailed message.             sonya ------

## 2013-08-08 NOTE — Telephone Encounter (Signed)
Called pt and gave her a new f/u appt for release to work.

## 2013-08-12 ENCOUNTER — Encounter (HOSPITAL_COMMUNITY): Payer: Self-pay | Admitting: General Surgery

## 2013-08-19 ENCOUNTER — Ambulatory Visit (INDEPENDENT_AMBULATORY_CARE_PROVIDER_SITE_OTHER): Payer: Self-pay | Admitting: General Surgery

## 2013-08-19 ENCOUNTER — Encounter (INDEPENDENT_AMBULATORY_CARE_PROVIDER_SITE_OTHER): Payer: Self-pay | Admitting: General Surgery

## 2013-08-19 ENCOUNTER — Telehealth (INDEPENDENT_AMBULATORY_CARE_PROVIDER_SITE_OTHER): Payer: Self-pay | Admitting: *Deleted

## 2013-08-19 ENCOUNTER — Encounter (INDEPENDENT_AMBULATORY_CARE_PROVIDER_SITE_OTHER): Payer: Self-pay

## 2013-08-19 ENCOUNTER — Other Ambulatory Visit (INDEPENDENT_AMBULATORY_CARE_PROVIDER_SITE_OTHER): Payer: Self-pay | Admitting: General Surgery

## 2013-08-19 VITALS — BP 116/68 | HR 72 | Temp 97.9°F | Resp 14 | Ht 62.0 in | Wt 148.4 lb

## 2013-08-19 DIAGNOSIS — K8020 Calculus of gallbladder without cholecystitis without obstruction: Secondary | ICD-10-CM

## 2013-08-19 NOTE — Patient Instructions (Signed)
Will get HIDA scan to rule out leak

## 2013-08-19 NOTE — Telephone Encounter (Signed)
LMOM for pt to return my call.  I was calling to notify her of her appt tomorrow 08/20/13 at [facility] with an arrival time of 6:45am.  Pt is to be NPO after midnight.

## 2013-08-20 ENCOUNTER — Encounter (INDEPENDENT_AMBULATORY_CARE_PROVIDER_SITE_OTHER): Payer: Self-pay | Admitting: General Surgery

## 2013-08-20 ENCOUNTER — Ambulatory Visit (HOSPITAL_COMMUNITY)
Admission: RE | Admit: 2013-08-20 | Discharge: 2013-08-20 | Disposition: A | Discharge status: Home / Self Care | Payer: Self-pay | Source: Ambulatory Visit | Attending: General Surgery | Admitting: General Surgery

## 2013-08-20 ENCOUNTER — Telehealth (INDEPENDENT_AMBULATORY_CARE_PROVIDER_SITE_OTHER): Payer: Self-pay

## 2013-08-20 DIAGNOSIS — Z9089 Acquired absence of other organs: Secondary | ICD-10-CM | POA: Insufficient documentation

## 2013-08-20 DIAGNOSIS — Z0389 Encounter for observation for other suspected diseases and conditions ruled out: Secondary | ICD-10-CM | POA: Insufficient documentation

## 2013-08-20 DIAGNOSIS — K8020 Calculus of gallbladder without cholecystitis without obstruction: Secondary | ICD-10-CM

## 2013-08-20 MED ORDER — TECHNETIUM TC 99M MEBROFENIN IV KIT
5.0000 | PACK | Freq: Once | INTRAVENOUS | Status: AC | PRN
  Administered 2013-08-20: 5 via INTRAVENOUS

## 2013-08-20 NOTE — Progress Notes (Signed)
Subjective:     Patient ID: [name], female   DOB: Oct 12, 1971, 42 y.o.   MRN: [id number]  HPI The patient is a 42 year old white female who is about 2-1/2 weeks status post laparoscopic cholecystectomy. We did not get a cholangiogram because of her contrast allergy but her liver functions were normal. She states that in some way she feels better but an otherwise she feels worse. She still has some tenderness in the right upper quadrant. She still having some nausea. She also complains of some chest and back pain. She denies any fevers or chills.  Review of Systems     Objective:   Physical Exam On exam her abdomen is soft. She has some mild to moderate right upper quadrant tenderness. There is no guarding or peritonitis. Her incisions are all healing nicely with no sign of infection    Assessment:     The patient has 2 half weeks status post laparoscopic cholecystectomy     Plan:     At this point the pain she is feeling maybe normal postoperative pain but it is difficult to tell if she could have a complication from the surgery. I will plan to get a HIDA scan to rule out bile leak. If this is negative and her symptoms are persisting and I will get an MRCP study to rule out retained common duct stone. I will plan to see her back in the next couple weeks to check her progress

## 2013-08-20 NOTE — Telephone Encounter (Signed)
Patient states she wanted to inform us she  had her Hepao. Liver function done this am . She is asking for Norco refill, I ask her if she had any left she stated yes 13. I expressed I would inform Dr. Carolynne Edouard

## 2013-08-20 NOTE — Telephone Encounter (Signed)
Called pt and let her know refill for norco at front desk, phenergan sent to pharmacy and to call us by end of week to let us know how she is doing. If no better MRCP needed.

## 2013-08-26 ENCOUNTER — Other Ambulatory Visit (INDEPENDENT_AMBULATORY_CARE_PROVIDER_SITE_OTHER): Payer: Self-pay | Admitting: *Deleted

## 2013-08-26 ENCOUNTER — Telehealth (INDEPENDENT_AMBULATORY_CARE_PROVIDER_SITE_OTHER): Payer: Self-pay | Admitting: *Deleted

## 2013-08-26 DIAGNOSIS — K8020 Calculus of gallbladder without cholecystitis without obstruction: Secondary | ICD-10-CM

## 2013-08-26 NOTE — Telephone Encounter (Signed)
I spoke with pt to notify her of her appt for MRI at [facility] on 09/01/13, she needs to arrive at 7:15 pm.  Instructed pt to be NPO 4 hours before exam.

## 2013-08-28 ENCOUNTER — Telehealth (INDEPENDENT_AMBULATORY_CARE_PROVIDER_SITE_OTHER): Payer: Self-pay

## 2013-08-28 NOTE — Telephone Encounter (Signed)
Pt called stating she is having severe uncontrolled pain from upper abd that is radiating thru to back. Nausea but no vomiting. No fever.  Pt states pain med not controlling pain. Pt very tearful. Pt advised I will send this to Dr Carolynne Edouard to review. Pt has MRI sched for 09-01-13. Pt advised to stay on clear liquids today until she gets call back. Pt will go to ER if she is not able to tolerate discomfort.

## 2013-08-28 NOTE — Telephone Encounter (Signed)
Called pt and advised her to go to ER per Dr Billey Chang request. Pt agreed.

## 2013-08-29 ENCOUNTER — Encounter (INDEPENDENT_AMBULATORY_CARE_PROVIDER_SITE_OTHER): Payer: Self-pay | Admitting: General Surgery

## 2013-09-01 ENCOUNTER — Ambulatory Visit
Admission: RE | Admit: 2013-09-01 | Discharge: 2013-09-01 | Disposition: A | Discharge status: Home / Self Care | Payer: No Typology Code available for payment source | Source: Ambulatory Visit | Attending: General Surgery | Admitting: General Surgery

## 2013-09-01 DIAGNOSIS — K8020 Calculus of gallbladder without cholecystitis without obstruction: Secondary | ICD-10-CM

## 2013-09-01 MED ORDER — GADOBENATE DIMEGLUMINE 529 MG/ML IV SOLN
13.0000 mL | Freq: Once | INTRAVENOUS | Status: AC | PRN
  Administered 2013-09-01: 13 mL via INTRAVENOUS

## 2013-09-02 ENCOUNTER — Telehealth (INDEPENDENT_AMBULATORY_CARE_PROVIDER_SITE_OTHER): Payer: Self-pay

## 2013-09-02 NOTE — Telephone Encounter (Signed)
Called pt with negative MRCP results. Will follow up next to see how she is feeling.

## 2013-09-02 NOTE — Telephone Encounter (Signed)
Message copied by Brennan Bailey on Tue Sep 02, 2013 10:51 AM ------      Message from: Caleen Essex III      Created: Tue Sep 02, 2013  8:56 AM       Negative for common duct stone ------

## 2013-09-10 ENCOUNTER — Ambulatory Visit (INDEPENDENT_AMBULATORY_CARE_PROVIDER_SITE_OTHER): Payer: Self-pay | Admitting: General Surgery

## 2013-09-10 ENCOUNTER — Encounter (INDEPENDENT_AMBULATORY_CARE_PROVIDER_SITE_OTHER): Payer: Self-pay | Admitting: General Surgery

## 2013-09-10 ENCOUNTER — Encounter (INDEPENDENT_AMBULATORY_CARE_PROVIDER_SITE_OTHER): Payer: Self-pay

## 2013-09-10 VITALS — BP 118/74 | HR 84 | Resp 16 | Ht 61.0 in | Wt 147.6 lb

## 2013-09-10 DIAGNOSIS — K8020 Calculus of gallbladder without cholecystitis without obstruction: Secondary | ICD-10-CM

## 2013-09-10 NOTE — Progress Notes (Signed)
Subjective:     Patient ID: [name], female   DOB: February 26, 1971, 42 y.o.   MRN: [id number]  HPI The patient is a 42 year old white female who is one month status post a laparoscopic cholecystectomy. Her postoperative course has been complicated by some significant right upper quadrant pain. She had an HIDA scan which showed no evidence of leak. She also had an MRI study that showed no evidence of retained stone. She is gradually feeling better. She still has some soreness but it is improving. She is only eating once a day because eating causes her to hurt. She denies any vomiting though.  Review of Systems     Objective:   Physical Exam On exam her abdomen is soft with minimal tenderness. Her incisions are healing nicely with no sign of infection.    Assessment:     The patient is one month status post laparoscopic cholecystectomy     Plan:     At this point I believe she is having postsurgical pain. I suspect that this will continue to get better than farther away from the surgery she gets. I will plan to see her back in one month to check her progress

## 2013-09-10 NOTE — Patient Instructions (Signed)
May return to work in 2 weeks 

## 2013-09-18 ENCOUNTER — Telehealth (INDEPENDENT_AMBULATORY_CARE_PROVIDER_SITE_OTHER): Payer: Self-pay

## 2013-09-18 NOTE — Telephone Encounter (Signed)
Pt called in stating she is still having pain at umbilical area. Dr Carolynne Edouard believes this is just post op pain that will get better over time. She has an appointment in a few weeks for follow up. She will watch area over the next week and if it does not get better or if pain worsens I advised her to call back and we could move her appointment up. She agrees to this plan.

## 2013-10-14 ENCOUNTER — Encounter (INDEPENDENT_AMBULATORY_CARE_PROVIDER_SITE_OTHER): Payer: Self-pay | Admitting: General Surgery

## 2013-11-07 ENCOUNTER — Encounter (INDEPENDENT_AMBULATORY_CARE_PROVIDER_SITE_OTHER): Payer: Self-pay | Admitting: General Surgery

## 2013-12-02 ENCOUNTER — Encounter (INDEPENDENT_AMBULATORY_CARE_PROVIDER_SITE_OTHER): Payer: Self-pay | Admitting: General Surgery

## 2013-12-02 ENCOUNTER — Ambulatory Visit (INDEPENDENT_AMBULATORY_CARE_PROVIDER_SITE_OTHER): Payer: Self-pay | Admitting: General Surgery

## 2013-12-02 VITALS — BP 118/68 | HR 72 | Temp 97.9°F | Resp 14 | Ht 61.0 in | Wt 148.2 lb

## 2013-12-02 DIAGNOSIS — K8020 Calculus of gallbladder without cholecystitis without obstruction: Secondary | ICD-10-CM

## 2013-12-02 NOTE — Patient Instructions (Signed)
May return to normal activities 

## 2013-12-02 NOTE — Progress Notes (Signed)
Subjective:     Patient ID: [name], female   DOB: 06/01/1971, 43 y.o.   MRN: [id number]  HPI The patient is a 43 year old white female who is 3 months status post laparoscopic cholecystectomy. She initially had some residual right upper quadrant pain but this has since resolved. She feels pretty good now. Her only complaint is that she only has a bowel movement about once a week and she has a lot of difficulty moving her bowels. She has tried MiraLAX and Metamucil with no significant improvement  Review of Systems     Objective:   Physical Exam On exam her abdomen is soft and nontender. Her incisions are all healing nicely with no sign of infection.    Assessment:     The patient is 3 months status post laparoscopic cholecystectomy     Plan:     At this point she may return to her normal activities without restrictions. We will plan to see her back on a when necessary basis. I have encouraged her to follow up with her medical doctor for the constipation issues.

## 2013-12-30 ENCOUNTER — Ambulatory Visit: Payer: Self-pay | Admitting: Internal Medicine

## 2014-01-07 ENCOUNTER — Ambulatory Visit: Payer: Self-pay | Admitting: Internal Medicine

## 2014-01-13 ENCOUNTER — Ambulatory Visit: Payer: Self-pay | Admitting: Family Medicine

## 2014-01-20 ENCOUNTER — Ambulatory Visit: Payer: Self-pay | Admitting: Internal Medicine

## 2014-02-02 ENCOUNTER — Encounter: Payer: Self-pay | Admitting: Internal Medicine

## 2014-02-03 ENCOUNTER — Encounter: Payer: Self-pay | Admitting: Internal Medicine

## 2014-02-03 ENCOUNTER — Ambulatory Visit (INDEPENDENT_AMBULATORY_CARE_PROVIDER_SITE_OTHER): Payer: Self-pay | Admitting: Internal Medicine

## 2014-02-03 VITALS — BP 120/80 | HR 69 | Temp 97.8°F | Ht 61.0 in | Wt 149.0 lb

## 2014-02-03 DIAGNOSIS — K5909 Other constipation: Secondary | ICD-10-CM

## 2014-02-03 DIAGNOSIS — J309 Allergic rhinitis, unspecified: Secondary | ICD-10-CM

## 2014-02-03 DIAGNOSIS — M129 Arthropathy, unspecified: Secondary | ICD-10-CM

## 2014-02-03 DIAGNOSIS — G43909 Migraine, unspecified, not intractable, without status migrainosus: Secondary | ICD-10-CM

## 2014-02-03 DIAGNOSIS — K5900 Constipation, unspecified: Secondary | ICD-10-CM

## 2014-02-03 DIAGNOSIS — M1990 Unspecified osteoarthritis, unspecified site: Secondary | ICD-10-CM | POA: Insufficient documentation

## 2014-02-03 MED ORDER — SUMATRIPTAN SUCCINATE 25 MG PO TABS: 25.0000 mg | ORAL_TABLET | ORAL | Status: DC | PRN

## 2014-02-03 NOTE — Progress Notes (Signed)
Pre visit review using our clinic review tool, if applicable. No additional management support is needed unless otherwise documented below in the visit note. 

## 2014-02-03 NOTE — Assessment & Plan Note (Signed)
Stop taking the goody powders Will trial meloxicam

## 2014-02-03 NOTE — Assessment & Plan Note (Signed)
Reports 2-3 BM per month Takes in 64 oz of water daily Is complinat with diet Discussed high fiber diet I want her to take Mirilax OTC daily Will refer to GI

## 2014-02-03 NOTE — Assessment & Plan Note (Signed)
I don't feel comfortable with imitrex injections Will give her PO imitrex

## 2014-02-03 NOTE — Patient Instructions (Addendum)

## 2014-02-03 NOTE — Progress Notes (Signed)
HPI Pt presents to the clinic today to establish care. She has not had a PCP in a few years. She does have multiple concerns today.  Migraines: She used to be on imitrex injections at home? She would like a refill today. Her migraines are not that frequent. She does have some sensitivity to light but not sound.  Constipation: She reports that she only has 2-3 bowel movements per month. She has tried everything OTC- mirilax, stools softeners, laxatives. None of that has made a difference. She would like referral to GI.  Pain in the ribs: She reports this started shortly after her cholecystectomy. She feels like an Anaconda is crushing her ribs. She denies any difficulty breathing or trauma to the ribs.  Arthritis: She reports all of her joints ache. She has tried all the OTC NSAIDS and tylenol. These provide no relief. She takes good powders all the time even though she feels like they don't help.  Facial pressure and pain. She does have some bloody discharge on the left side of her nasal passage. She is not blowing any mucous out of her nose. She denies fever, chills or body aches. She has not taken anything OTC.  Past Medical History  Diagnosis Date  . Migraines   . Complication of anesthesia   . PONV (postoperative nausea and vomiting)   . Anxiety   . Depression   . GERD (gastroesophageal reflux disease)   . Chest pain     a. Felt noncardiac with normal stress echo 11/2012.  Marland Kitchen Cholelithiasis     a. on Abd [place] 11/2012.  Marland Kitchen Anemia     Current Outpatient Prescriptions  Medication Sig Dispense Refill  . ALPRAZolam (XANAX) 1 MG tablet Take 0.5 mg by mouth at bedtime as needed for sleep.      . Aspirin-Acetaminophen-Caffeine (GOODY HEADACHE PO) Take 1 packet by mouth every 4 (four) hours as needed (for pain).      . calcium carbonate (TUMS) 500 MG chewable tablet Chew 2 tablets by mouth 3 (three) times daily.      . Naproxen Sodium (ALEVE) 220 MG CAPS Take 440 mg by mouth daily as needed  (pain).       No current facility-administered medications for this visit.    Allergies  Allergen Reactions  . Other Nausea And Vomiting    "sleeping gas"= makes me vomit  (even when a sleep)  . Latex Hives, Itching and Rash  . Amoxicillin Nausea Only  . Iohexol      Desc: Pt had throat tightness, slight swelling. Itchy eyes and itchy gums.  Dr Molli Posey spoke w/ pt.  Treating this like possible/early reaction.  Pt was counseled. Needs full premeds w/ IV contrast, txs., Onset Date: [id number]   . Doxycycline Rash  . Erythromycin Rash    Family History  Problem Relation Age of Onset  . Heart disease Father   . Cancer Maternal Grandmother     ovarian  . Heart disease Maternal Grandmother   . Stroke Maternal Grandmother   . Heart disease Maternal Grandfather   . Heart disease Paternal Grandmother   . Diabetes Paternal Grandfather   . Stroke Paternal Grandfather   . Cancer Maternal Aunt     breast  . Heart disease Paternal Uncle   . Diabetes Paternal Uncle     History   Social History  . Marital Status: Divorced    Spouse Name: N/A    Number of Children: N/A  . Years of Education: N/A  Occupational History  . Packer at [employer]    Social History Main Topics  . Smoking status: Former Smoker -- 0.10 packs/day for 1 years    Types: Cigarettes    Start date: 04/10/2012    Quit date: 08/07/2013  . Smokeless tobacco: Never Used  . Alcohol Use: No  . Drug Use: No  . Sexual Activity: Not Currently   Other Topics Concern  . Not on file   Social History Narrative  . No narrative on file    ROS:  Constitutional: Pt reports headchce. Denies fever, malaise, fatigue, or abrupt weight changes.  HEENT: Pt reports facial pain and pressure. Denies eye pain, eye redness, ear pain, ringing in the ears, wax buildup, runny nose, nasal congestion, bloody nose, or sore throat. Respiratory: Denies difficulty breathing, shortness of breath, cough or sputum production.    Cardiovascular: Denies chest pain, chest tightness, palpitations or swelling in the hands or feet.  Gastrointestinal: Pt reports constipation. Denies abdominal pain, bloating, diarrhea or blood in the stool.  Musculoskeletal: Pt reports joint pain and swelling. Denies decrease in range of motion, difficulty with gait, muscle pain.    No other specific complaints in a complete review of systems (except as listed in HPI above).  PE:  BP 120/80  Pulse 69  Temp(Src) 97.8 F (36.6 C) (Tympanic)  Ht 5\' 1"  (1.549 m)  Wt 149 lb (67.586 kg)  BMI 28.17 kg/m2  SpO2 95% Wt Readings from Last 3 Encounters:  02/03/14 149 lb (67.586 kg)  12/02/13 148 lb 3.2 oz (67.223 kg)  09/10/13 147 lb 10.1 oz (66.965 kg)    General: Appears her stated age, well developed, well nourished in NAD. HEENT: Head: normal shape and size; Eyes: sclera white, no icterus, conjunctiva pink, PERRLA and EOMs intact; Ears: Tm's gray and intact, normal light reflex; Nose: mucosa boggy and moist, septum midline; Throat/Mouth: Teeth present, mucosa pink and moist, no lesions or ulcerations noted.  Cardiovascular: Normal rate and rhythm. S1,S2 noted.  No murmur, rubs or gallops noted. No JVD or BLE edema. No carotid bruits noted. Pulmonary/Chest: Normal effort and positive vesicular breath sounds. No respiratory distress. No wheezes, rales or ronchi noted.  Abdomen: Soft and nontender. Hypoactive bowel sounds, no bruits noted. Slightly distended. No masses noted. Liver, spleen and kidneys non palpable.     BMET    Component Value Date/Time   NA 138 08/04/2013 1359   K 4.2 08/04/2013 1359   CL 104 08/04/2013 1359   CO2 24 08/04/2013 1359   GLUCOSE 78 08/04/2013 1359   BUN 19 08/04/2013 1359   CREATININE 0.54 08/04/2013 1359   CALCIUM 9.0 08/04/2013 1359   GFRNONAA >90 08/04/2013 1359   GFRAA >90 08/04/2013 1359    Lipid Panel     Component Value Date/Time   CHOL 176 12/12/2012 1115   TRIG 85 12/12/2012 1115   HDL 56  12/12/2012 1115   CHOLHDL 3.1 12/12/2012 1115   VLDL 17 12/12/2012 1115   LDLCALC 103* 12/12/2012 1115    CBC    Component Value Date/Time   WBC 6.5 08/04/2013 1359   RBC 4.43 08/04/2013 1359   HGB 12.1 08/04/2013 1359   HCT 36.2 08/04/2013 1359   PLT 242 08/04/2013 1359   MCV 81.7 08/04/2013 1359   MCH 27.3 08/04/2013 1359   MCHC 33.4 08/04/2013 1359   RDW 13.8 08/04/2013 1359   LYMPHSABS 1.6 07/18/2013 1005   MONOABS 0.2 07/18/2013 1005   EOSABS 0.0 07/18/2013 1005  BASOSABS 0.0 07/18/2013 1005    Hgb A1C No results found for this basename: HGBA1C     Assessment and Plan:  Allergic Rhinitis:  Try zyrtec daily OTC  RTC in 6 months for annual physical

## 2014-02-16 ENCOUNTER — Telehealth: Payer: Self-pay

## 2014-02-16 NOTE — Telephone Encounter (Signed)
lmovm her son's Jaykob phone asking for him to have her give us a call as her number on file keeps saying "invalid" call

## 2014-02-16 NOTE — Telephone Encounter (Signed)
I know the constipation is chronic. I have referred her to GI for this. Not sure what is causing the squeezing sensation but since it started after her gallbladder was removed, maybe GI can address this as well.

## 2014-02-16 NOTE — Telephone Encounter (Signed)
Pt left v/m; pt wanted Rebecca Reaperegina Baity NP to know that constipation is not a new problem and pt has had constipation for as far back as pt can remember; the pain between the ribs (squeezing and crushing feeling) just started after gallblader was removed. Pt request cb with Regina's thoughts.

## 2014-02-17 NOTE — Telephone Encounter (Signed)
Pt left v/m requesting cb at [phone].

## 2014-02-19 ENCOUNTER — Other Ambulatory Visit: Payer: Self-pay | Admitting: Internal Medicine

## 2014-02-19 DIAGNOSIS — K5909 Other constipation: Secondary | ICD-10-CM

## 2014-02-19 NOTE — Telephone Encounter (Signed)
Referral placed.

## 2014-02-19 NOTE — Telephone Encounter (Signed)
Pt states she has seen GI in the past and they could not come up with anything--but she states she is okay with the referral being that she is still in a lot of pain

## 2014-02-20 ENCOUNTER — Encounter: Payer: Self-pay | Admitting: Gastroenterology

## 2014-02-26 ENCOUNTER — Telehealth (INDEPENDENT_AMBULATORY_CARE_PROVIDER_SITE_OTHER): Payer: Self-pay

## 2014-02-26 NOTE — Telephone Encounter (Signed)
Pt called stating she wants Dr Carolynne Edouardoth to be aware she is still having a very tight feeling in her upper abd. No swelling. No vomiting. Pt states she noticed this after surgery and it has continued. I advised pt there is a detailed note in epic from the NP that saw her in March. Pt states she has been referred to GI by her NP. I advised pt to continue with this work up to see if there is any indicator for this symptom. Pt advised I will send this msg to Dr Carolynne Edouardoth to be aware that she called.

## 2014-03-09 ENCOUNTER — Telehealth: Payer: Self-pay

## 2014-03-09 DIAGNOSIS — K5909 Other constipation: Secondary | ICD-10-CM

## 2014-03-09 NOTE — Telephone Encounter (Signed)
Last OV was 02/03/14 with you and Rx was prescribed--please asdvise

## 2014-03-10 MED ORDER — SUMATRIPTAN SUCCINATE 25 MG PO TABS: 25.0000 mg | ORAL_TABLET | ORAL | Status: DC | PRN

## 2014-03-10 NOTE — Telephone Encounter (Signed)
Sorry Imitrex

## 2014-03-10 NOTE — Telephone Encounter (Signed)
Ok to refill imitrex

## 2014-03-10 NOTE — Telephone Encounter (Signed)
Rx called in to pharmacy. 

## 2014-03-10 NOTE — Telephone Encounter (Signed)
What med?

## 2014-03-24 NOTE — Telephone Encounter (Signed)
Pt left v/m;pt request status of refill of Imitrex to [facility] [address]. Med was called to [facility] [address]. Spoke with [facility]ist at [facility] [address] and cancelled imitrex rx. Medication phoned to [facility] [address] pharmacy as instructed. Left v/m for pt to cb.

## 2014-03-26 NOTE — Telephone Encounter (Signed)
Pt notified imitrex was changed to [facility] [address]. Pt voiced understanding.

## 2014-04-13 ENCOUNTER — Ambulatory Visit: Payer: Self-pay | Admitting: Gastroenterology

## 2014-07-30 ENCOUNTER — Other Ambulatory Visit: Payer: Self-pay | Admitting: Internal Medicine

## 2014-07-30 DIAGNOSIS — Z0000 Encounter for general adult medical examination without abnormal findings: Secondary | ICD-10-CM

## 2014-08-03 ENCOUNTER — Other Ambulatory Visit: Payer: Self-pay

## 2014-08-06 ENCOUNTER — Encounter: Payer: Self-pay | Admitting: Internal Medicine

## 2014-08-06 DIAGNOSIS — Z0289 Encounter for other administrative examinations: Secondary | ICD-10-CM

## 2014-08-07 ENCOUNTER — Telehealth: Payer: Self-pay | Admitting: Internal Medicine

## 2014-08-07 NOTE — Telephone Encounter (Signed)
Patient did not come for their scheduled appointment 08/06/14 for cpe.  Please let me know if the patient needs to be contacted immediately for follow up or if no follow up is necessary.

## 2014-08-07 NOTE — Telephone Encounter (Signed)
They can reschedule at their convienience

## 2014-09-06 ENCOUNTER — Other Ambulatory Visit: Payer: Self-pay | Admitting: Internal Medicine

## 2014-11-23 ENCOUNTER — Other Ambulatory Visit: Payer: Self-pay | Admitting: Family Medicine

## 2014-11-23 ENCOUNTER — Ambulatory Visit
Admission: RE | Admit: 2014-11-23 | Discharge: 2014-11-23 | Disposition: A | Discharge status: Home / Self Care | Payer: No Typology Code available for payment source | Source: Ambulatory Visit | Attending: Family Medicine | Admitting: Family Medicine

## 2014-11-23 DIAGNOSIS — M5489 Other dorsalgia: Secondary | ICD-10-CM

## 2015-09-24 ENCOUNTER — Other Ambulatory Visit: Payer: Self-pay | Admitting: Orthopaedic Surgery

## 2015-09-28 ENCOUNTER — Other Ambulatory Visit: Payer: Self-pay | Admitting: Orthopaedic Surgery

## 2015-09-28 DIAGNOSIS — M545 Low back pain: Secondary | ICD-10-CM

## 2015-09-28 DIAGNOSIS — M542 Cervicalgia: Secondary | ICD-10-CM

## 2016-09-13 ENCOUNTER — Telehealth: Payer: Self-pay

## 2016-09-13 NOTE — Telephone Encounter (Signed)
[facility]...the patient has not been seen since 2015 and pt states that she has been seeing Dr Aida PufferJames Little of [facility]---c/o of tingling of head and arm, tremors x 2-3 weeks---pt states that she already has a referral to Neuro per Dr Clarene DukeLittle and wanted to get 2nd opinion--- pt has not been seen x 2-3 years... Pt advised it would be best to f/u with Dr Clarene DukeLittle as he is the provider recently responsible for her care and has been referred to Neuro--- I advised pt to call Dr Fredirick MaudlinLittle's office or Neuro to see about moving up appt.... I placed pt on brief hold to request guidance verbally from Dr Para Marchuncan and he was agreeable with the plan for pt to call current PCP or Neuro to move up appt or go to ED if Sx worsen... Before I could even let pt know what she needed to do, she hung up the phone on me

## 2016-09-18 ENCOUNTER — Telehealth (INDEPENDENT_AMBULATORY_CARE_PROVIDER_SITE_OTHER): Payer: Self-pay | Admitting: Orthopaedic Surgery

## 2016-09-18 NOTE — Telephone Encounter (Signed)
Patient would like to know if Dr. Cleophas DunkerWhitfield would like for her to have an MRI of her neck and back since she is supposed to have an MRI of neck already. Patient has been having very bad lower back pain.

## 2016-09-19 NOTE — Telephone Encounter (Signed)
Please advise 

## 2016-09-19 NOTE — Telephone Encounter (Signed)
Please discuss with Dr Cleophas DunkerWhitfield.  No notes in EPIC for me to know

## 2016-09-19 NOTE — Telephone Encounter (Signed)
Have not seen pt since 10/16-cannot order scans until seen again

## 2016-09-19 NOTE — Telephone Encounter (Signed)
Patient called. She is requesting orders for cervical & lumber MRI's be sent back in from last year (see SRS). She also wants Bil hips included as she is having hip pain.

## 2016-09-20 NOTE — Telephone Encounter (Signed)
Left VM for pt that she needs to make an appt before we can order an MRI

## 2016-09-26 ENCOUNTER — Encounter: Payer: Self-pay | Admitting: Neurology

## 2016-09-26 ENCOUNTER — Ambulatory Visit (INDEPENDENT_AMBULATORY_CARE_PROVIDER_SITE_OTHER): Payer: Self-pay | Admitting: Neurology

## 2016-09-26 VITALS — BP 135/90 | HR 86 | Ht 61.0 in | Wt 163.0 lb

## 2016-09-26 DIAGNOSIS — G43709 Chronic migraine without aura, not intractable, without status migrainosus: Secondary | ICD-10-CM

## 2016-09-26 DIAGNOSIS — R251 Tremor, unspecified: Secondary | ICD-10-CM | POA: Insufficient documentation

## 2016-09-26 MED ORDER — DIPHENHYDRAMINE HCL 25 MG PO TABS: 25.0000 mg | ORAL_TABLET | Freq: Two times a day (BID) | ORAL | 0 refills | Status: DC

## 2016-09-26 MED ORDER — CLONAZEPAM 0.5 MG PO TABS: 0.5000 mg | ORAL_TABLET | Freq: Two times a day (BID) | ORAL | 0 refills | Status: DC

## 2016-09-26 NOTE — Patient Instructions (Addendum)
-   will prescribe clonazepam 0.5mg  twice a day for tremor - will recommend benadryl 25mg  twice a day for tremor - will do MRI to rule out brain structual lesion - will draw blood for test today - will refer to Dr. Frances FurbishAthar ASAP for evaluation - relaxation and de-stress - continue flexeril and avoid phenergan or reglan or compazine - follow up after seen by Dr. Frances FurbishAthar

## 2016-09-26 NOTE — Progress Notes (Signed)
NEUROLOGY CLINIC NEW PATIENT NOTE  NAME: [name] DOB: 09/28/1971 REFERRING PHYSICIAN: Lorre MunroeBaity, Regina W, NP  I saw Zahra S Sacks as a new consult in the neurovascular clinic today regarding  Chief Complaint  Patient presents with  . Referral    Referral from  Dr. Aida PufferJames Little MD for two week of tremors, pain in right arm, weak on left side. Has hestiant speech and constant shaking Pt goes to orthopedic Dr. Lessie DingsWhitefield for joint pain, memory loss  .  HPI: [name] is a 45 y.o. female with PMH of migraine who presents as a new patient for body tremor. She was accompanied by her husband.  She stated that she was at her usual health 2-3 weeks ago when she was watching TV and laughing and speaking, suddenly, husband witnessed that she started to have left hand tremor, spread to head, neck and then left arm and left leg, then "whold body", no LOC. It lasted about 15 min and then eased off some but continued to have left arm and neck tremor until now. The tremor fluctuates, stress makes it worse, neck postural movement makes it worse such as bending down and looking up. Lying down will ease off the tremor, and no tremor during sleep. She also felt weakness on the left arm but as per husband it is due to her previous shoulder hx a few years ago, no change this time.   4-5 days prior to this happening, she was given soma for her HA. She had been on soma in the past without problem. However, this time she did not feel well with soma. After 2-3 days of soma, she went back to flexeril before tremor started. She had been on Xanax for 2 years but it was stopped "cold [place]" last week. She is still on flexeril now. She has not taking hydrocodone as prescribe as it makes her sick. She also takes phenergan almost daily until last week for nausea due to HA, but not taking for a week.   She has long history of migraine HA. She saw Dr. Sandria ManlyLove here in 2010. Her HA is daily chronic HA now. In the past, she  had typical migraine, 2 times per week, has to go to dark room and lock herself up, associated with N/V, used goody powder and exedrine, some effectiveness but not abort the HA. As per note, she had tried topamax, depakote, inderal, maxalt, botox injections and midrin for HA, but not effective. MRI in 2010 showed unremarkable except a solitary area of nonspecific WM hyperdensity in the right parietal region. MRA negative.  She had one episode in 2007 with HA and slurry speech, concerning for stroke. She was [facility], Dr. Sandria ManlyLove saw her too at that time. As per pt, she had work up for stroke but was told not a stroke.   Past Medical History:  Diagnosis Date  . Anemia   . Anxiety   . Chest pain    a. Felt noncardiac with normal stress echo 11/2012.  Marland Kitchen. Cholelithiasis    a. on Abd US 11/2012.  Marland Kitchen. Complication of anesthesia   . Depression   . GERD (gastroesophageal reflux disease)   . Migraines   . PONV (postoperative nausea and vomiting)   . Radiculopathy of cervical region    Past Surgical History:  Procedure Laterality Date  . CHOLECYSTECTOMY N/A 08/07/2013   Procedure: LAPAROSCOPIC CHOLECYSTECTOMY;  Surgeon: Robyne AskewPaul S Toth III, MD;  Location: [facility] OR;  Service:  General;  Laterality: N/A;  . CLEFT LIP REPAIR     Multiple  . CLEFT PALATE REPAIR     Multiple  . ROTATOR CUFF REPAIR  2010   left  . TUBAL LIGATION  1998   Family History  Problem Relation Age of Onset  . Migraines Mother   . Heart disease Father   . Cancer Maternal Grandmother     ovarian  . Heart disease Maternal Grandmother   . Stroke Maternal Grandmother   . Heart disease Maternal Grandfather   . Heart disease Paternal Grandmother   . Diabetes Paternal Grandfather   . Stroke Paternal Grandfather   . Cancer Maternal Aunt     breast  . Heart disease Paternal Uncle   . Diabetes Paternal Uncle   . Stroke Paternal Uncle    Current Outpatient Prescriptions  Medication Sig Dispense Refill  . Cholecalciferol (D3  ADULT PO) Take 50,000 Units by mouth.    . cyclobenzaprine (FLEXERIL) 10 MG tablet Take 10 mg by mouth 3 (three) times daily as needed for muscle spasms.    Marland Kitchen HYDROcodone-acetaminophen (NORCO/VICODIN) 5-325 MG tablet Take 1 tablet by mouth every 6 (six) hours as needed for moderate pain.    . valsartan (DIOVAN) 80 MG tablet TAKE ONE TABLET BY MOUTH ONCE DAILY FOR BLOOD PRESSURE  5  . clonazePAM (KLONOPIN) 0.5 MG tablet Take 1 tablet (0.5 mg total) by mouth 2 (two) times daily. 60 tablet 0  . diphenhydrAMINE (BENADRYL) 25 MG tablet Take 1 tablet (25 mg total) by mouth 2 (two) times daily. 60 tablet 0   No current facility-administered medications for this visit.    Allergies  Allergen Reactions  . Other Nausea And Vomiting    "sleeping gas"= makes me vomit  (even when a sleep)  . Latex Hives, Itching and Rash  . Amoxicillin Nausea Only  . Iohexol      Desc: Pt had throat tightness, slight swelling. Itchy eyes and itchy gums.  Dr Molli Posey spoke w/ pt.  Treating this like possible/early reaction.  Pt was counseled. Needs full premeds w/ IV contrast, txs., Onset Date: [id number]   . Doxycycline Rash  . Erythromycin Rash   Social History   Social History  . Marital status: Married    Spouse name: N/A  . Number of children: N/A  . Years of education: N/A   Occupational History  . Packer at [employer]    Social History Main Topics  . Smoking status: Former Smoker    Packs/day: 0.10    Years: 1.00    Types: Cigarettes    Start date: 04/10/2012    Quit date: 08/07/2013  . Smokeless tobacco: Never Used  . Alcohol use No  . Drug use: No  . Sexual activity: Not Currently   Other Topics Concern  . Not on file   Social History Narrative  . No narrative on file    Review of Systems Full 14 system review of systems performed and notable only for those listed, all others are neg:  Constitutional:  Fatigue, weight loss Cardiovascular:  Ear/Nose/Throat:   Skin:  Eyes:  Blurry  vision Respiratory:  snoring Gastroitestinal:  constipation Genitourinary:  Hematology/Lymphatic:   Endocrine: feeling hot, increased thirst Musculoskeletal:  Joint pain, cramps, aching muscles Allergy/Immunology:  Allergies, runny nose Neurological:  Memory loss, confusion, HA, numbness, weakness, slurry speech, dizziness, tremor Psychiatric: not enough sleep, decreased energy, disinterest in activities Sleep: insomnia, restless leg   Physical Exam  Vitals:  09/26/16 0845  BP: 135/90  Pulse: 86    General - Well nourished, well developed, discomfort with neck tremor.  Ophthalmologic - not able to test due to neck tremor.  Cardiovascular - Regular rate and rhythm with no murmur.   Neck - constant tremor causing neck movement, more prominent with neck posture with neck flexion or extension  Mental Status -  Level of arousal and orientation to time, place, and person were intact. Language including expression, naming, repetition, comprehension found intact, however, broken sentences due to neck tremor. Attention span and concentration were normal. Recent and remote memory were intact. Fund of Knowledge was assessed and was intact.  Cranial Nerves II - XII - II - Visual field intact OU. III, IV, VI - Extraocular movements intact. V - Facial sensation intact bilaterally. VII - Facial movement intact bilaterally. VIII - Hearing & vestibular intact bilaterally. X - Palate elevates symmetrically, no palate myoclonus. XI - Chin turning & shoulder shrug intact bilaterally. XII - Tongue protrusion intact.  Motor Strength - The patient's strength was normal in all extremities and pronator drift was absent.  Bulk was normal and fasciculations were absent.   Motor Tone - Muscle tone was assessed at the neck and appendages and was normal.  Reflexes - The patient's reflexes were normal in all extremities and she had no pathological reflexes.  Sensory - Light touch,  temperature/pinprick were assessed and were normal.    Coordination - The patient had bilateral FTN mild dysmetria could be due to neck tremor interfering with action. Constant neck tremor, more prominent with neck posture with neck flexion or extension. Mild postural tremor of both hand, L>R.   Gait and Station - cautious gait due to neck tremor.   Imaging  I have personally reviewed the radiological images below and agree with the radiology interpretations.  05/2006 MRI and MRA IMPRESSION: 1.  No acute infarct or abnormal intracranial enhancing lesion.  2.  Minimal nonspecific white matter type changes. 3.  Small focal bulges left internal carotid artery and right posterior cerebral artery as described above.  Small aneurysms cannot be excluded.  08/2009 MRI and MRA MRI scan of the brain shows a solitary area of nonspecific WM hyperdensity in the right parietal periventricular region.  MRA of the brain showed no significant stenosis on a medium-to-large size intracranial vessels.   11/2012 stress test Normal study after maximal exercise. Good exercise tolerance. Patient had some chest tightness on treadmill but no evidence for ischemia by ECG or echo.   Lab Review none   Assessment and Plan:   In summary, Quisha S Lapaglia is a 45 y.o. female with PMH of migraine HA presents new acute onset neck and hand tremor, more prominent at neck with neck posture. She denies any illicit drug, but did take soma and phenergan before this happened which made her feel ill although she had those medications in the past. However, the tremor continued after medication discontinued. DDx including dystonic tremor due to drug reaction, rubral tremor due to intracranial structural lesion, cerebellar tremor, heavy metal poisoning or multiple sclerosis. Will do MRI brian with and without contrast, imperical treatment with anticholinergics and benzos. Refer to movement specialist.  - will prescribe  clonazepam 0.5mg  bid for tremor - will recommend benadryl 25mg  bid for tremor. If not effective, can try cogentin.  - will do MRI to rule out brain structual lesion - will check TSH, B12 and rule out heavy metal poisoning  - will refer to  Dr. Frances Furbish ASAP for evaluation - relaxation and de-stress - continue flexeril and avoid phenergan or reglan or compazine or soma - follow up after seen by Dr. Frances Furbish  A total of 45 minutes was spent face-to-face with this patient. Over half this time was spent on counseling patient on the tremor diagnosis and different diagnostic and therapeutic options available.   Thank you very much for the opportunity to participate in the care of this patient.  Please do not hesitate to call if any questions or concerns arise.  Orders Placed This Encounter  Procedures  . MR BRAIN W WO CONTRAST    Standing Status:   Future    Standing Expiration Date:   11/26/2017    Order Specific Question:   If indicated for the ordered procedure, I authorize the administration of contrast media per Radiology protocol    Answer:   Yes    Order Specific Question:   Reason for Exam (SYMPTOM  OR DIAGNOSIS REQUIRED)    Answer:   neck tremor    Order Specific Question:   Preferred imaging location?    Answer:   Internal    Order Specific Question:   What is the patient's sedation requirement?    Answer:   No Sedation    Order Specific Question:   Does the patient have a pacemaker or implanted devices?    Answer:   No  . CBC (no diff)  . Basic metabolic panel  . CK  . Heavy Metals, Blood    Order Specific Question:   [place] of residence?    Answer:   [place] [[id number]]  . TSH + free T4  . Vitamin B12    Meds ordered this encounter  Medications  . valsartan (DIOVAN) 80 MG tablet    Sig: TAKE ONE TABLET BY MOUTH ONCE DAILY FOR BLOOD PRESSURE    Refill:  5  . HYDROcodone-acetaminophen (NORCO/VICODIN) 5-325 MG tablet    Sig: Take 1 tablet by mouth every 6 (six) hours as needed for  moderate pain.  . Cholecalciferol (D3 ADULT PO)    Sig: Take 50,000 Units by mouth.  . cyclobenzaprine (FLEXERIL) 10 MG tablet    Sig: Take 10 mg by mouth 3 (three) times daily as needed for muscle spasms.  . diphenhydrAMINE (BENADRYL) 25 MG tablet    Sig: Take 1 tablet (25 mg total) by mouth 2 (two) times daily.    Dispense:  60 tablet    Refill:  0  . clonazePAM (KLONOPIN) 0.5 MG tablet    Sig: Take 1 tablet (0.5 mg total) by mouth 2 (two) times daily.    Dispense:  60 tablet    Refill:  0    Patient Instructions  - will prescribe clonazepam 0.5mg  twice a day for tremor - will recommend benadryl 25mg  twice a day for tremor - will do MRI to rule out brain structual lesion - will draw blood for test today - will refer to Dr. Frances Furbish ASAP for evaluation - relaxation and de-stress - continue flexeril and avoid phenergan or reglan or compazine - follow up after seen by Dr. Christain Sacramento, MD PhD [facility] [address] [place], [place] [address] [phone]

## 2016-09-28 ENCOUNTER — Other Ambulatory Visit: Payer: Self-pay | Admitting: Neurology

## 2016-09-28 DIAGNOSIS — R251 Tremor, unspecified: Secondary | ICD-10-CM

## 2016-09-28 DIAGNOSIS — Z77018 Contact with and (suspected) exposure to other hazardous metals: Secondary | ICD-10-CM

## 2016-09-28 LAB — BASIC METABOLIC PANEL
BUN/Creatinine Ratio: 17 (ref 9–23)
BUN: 13 mg/dL (ref 6–24)
CALCIUM: 9.9 mg/dL (ref 8.7–10.2)
CO2: 26 mmol/L (ref 18–29)
Chloride: 104 mmol/L (ref 96–106)
Creatinine, Ser: 0.77 mg/dL (ref 0.57–1.00)
GFR calc Af Amer: 106 mL/min/{1.73_m2} (ref >59)
GFR calc non Af Amer: 92 mL/min/{1.73_m2} (ref >59)
Glucose: 108 mg/dL — ABNORMAL HIGH (ref 65–99)
POTASSIUM: 4.7 mmol/L (ref 3.5–5.2)
SODIUM: 145 mmol/L — AB (ref 134–144)

## 2016-09-28 LAB — HEAVY METALS, BLOOD
Arsenic: 8 ug/L (ref 2–23)
LEAD, BLOOD: NOT DETECTED ug/dL (ref 0–19)
Mercury: NOT DETECTED ug/L (ref 0.0–14.9)

## 2016-09-28 LAB — CBC
Hematocrit: 40.3 % (ref 34.0–46.6)
Hemoglobin: 13.1 g/dL (ref 11.1–15.9)
MCH: 27.9 pg (ref 26.6–33.0)
MCHC: 32.5 g/dL (ref 31.5–35.7)
MCV: 86 fL (ref 79–97)
Platelets: 262 10*3/uL (ref 150–379)
RBC: 4.69 x10E6/uL (ref 3.77–5.28)
RDW: 13.7 % (ref 12.3–15.4)
WBC: 5.8 10*3/uL (ref 3.4–10.8)

## 2016-09-28 LAB — TSH+FREE T4
Free T4: 1.34 ng/dL (ref 0.82–1.77)
TSH: 1.14 u[IU]/mL (ref 0.450–4.500)

## 2016-09-28 LAB — CK: CK TOTAL: 52 U/L (ref 24–173)

## 2016-09-28 LAB — VITAMIN B12: Vitamin B-12: 390 pg/mL (ref 211–946)

## 2016-10-02 ENCOUNTER — Encounter (INDEPENDENT_AMBULATORY_CARE_PROVIDER_SITE_OTHER): Payer: Self-pay | Admitting: Orthopaedic Surgery

## 2016-10-02 ENCOUNTER — Telehealth: Payer: Self-pay | Admitting: Neurology

## 2016-10-02 ENCOUNTER — Ambulatory Visit (INDEPENDENT_AMBULATORY_CARE_PROVIDER_SITE_OTHER): Payer: Self-pay | Admitting: Orthopaedic Surgery

## 2016-10-02 VITALS — BP 111/87 | HR 72 | Resp 14 | Ht 63.0 in | Wt 163.0 lb

## 2016-10-02 DIAGNOSIS — M542 Cervicalgia: Secondary | ICD-10-CM

## 2016-10-02 NOTE — Telephone Encounter (Signed)
DR Jabier MuttonXU REQ APPT WITH ATHAR ASAP THEN RETURN TO HIM ASAP. FIRST AVAIL WAS 12/11. ATHAR HAD CX THIS WEEK. R/S FROM 12/11 TO 11/22 WITH ATHAR.  DR Roda ShuttersXU... YOUR SCHEDULE REMAINS FULL. WHEN DO YOU WANT HER BACK WITH YOU?

## 2016-10-02 NOTE — Progress Notes (Signed)
Office Visit Note   Patient: [name]           Date of Birth: 03/08/1971           MRN: [id number] Visit Date: 10/02/2016              Requested by: Lorre Munroeegina W Baity, NP [address] [place], [place] [id number] PCP: Aida PufferLITTLE,JAMES, MD   Assessment & Plan: Visit Diagnoses: No diagnosis found.  Plan:f/u after MRI cspine  Follow-Up Instructions: No Follow-up on file.   Orders:  No orders of the defined types were placed in this encounter.  No orders of the defined types were placed in this encounter.     Procedures: No procedures performed   Clinical Data: No additional findings.   Subjective: No chief complaint on file.  [name] is accompanied by her son and here for evaluation of an acute exacerbation of neck pain. I last saw her in October 2016 for evaluation of chronic low back and cervical  Pain. She had had films demonstrating congenital fusion of C2-C3 as well as degenerative changes at C4-5, C5-6 and C6-7. I suggested an MRI scan of her cervical spine but she was unable to follow-up. She has had an exacerbation of her neck pain since October 18 when she experienced rather sudden onset of shaking of both upper extremities associated with stuttering. She has been followed by her neurologist and is scheduled to have an MRI scan of her brain on October 29. She relates that her neck pops and cracks and has limited range of motion. She is really not had any significant numbness or tingling to either upper extremity. Pt has been shaking and having  tremors since 08/30/16.  She has been seen by Dr. Roda ShuttersXu [facility]( [facility] Also seeing Dr. Alfredia ClientAvatha for specialist in tremors in womens health in same office at neurologist.  She has severe neck pain due to the shaking. She is scheduled for a MRI of Brain 10/11/16.   She wants to know if you can schedule and MRI for the C-spine and L-spine.   Review of Systems  Constitutional: Negative.   HENT: Negative.   Eyes: Negative.     Respiratory: Negative.   Cardiovascular: Negative.   Gastrointestinal: Negative.   Musculoskeletal: Positive for neck pain.  Skin: Negative.   Allergic/Immunologic: Negative.   Neurological: Positive for tremors.  Hematological: Negative.   Psychiatric/Behavioral: Negative.      Objective: Vital Signs: There were no vitals taken for this visit.  Physical Exam  Ortho Exam on examination [place] did have some limitation of motion of the cervical spine. She could slowly and barely touch her chin to her chest. She had incomplete neck extension without any referred pain to either upper extremity. She had about 45 of rotation of the right the left. She was experiencing neck pain and no referred pain to either shoulder or either upper extremity. She did shake throughout the office evaluation associated with tremors.Marland Kitchen.  Specialty Comments:  No specialty comments available.  Imaging: No results found.   PMFS History: Patient Active Problem List   Diagnosis Date Noted  . Tremor 09/26/2016  . Chronic constipation 02/03/2014  . Arthritis 02/03/2014  . Tobacco use 12/11/2012  . Migraine headache 01/10/2007   Past Medical History:  Diagnosis Date  . Anemia   . Anxiety   . Chest pain    a. Felt noncardiac with normal stress echo 11/2012.  Marland Kitchen. Cholelithiasis    a. on  Abd US 11/2012.  Marland Kitchen. Complication of anesthesia   . Depression   . GERD (gastroesophageal reflux disease)   . Migraines   . PONV (postoperative nausea and vomiting)   . Radiculopathy of cervical region     Family History  Problem Relation Age of Onset  . Migraines Mother   . Heart disease Father   . Cancer Maternal Grandmother     ovarian  . Heart disease Maternal Grandmother   . Stroke Maternal Grandmother   . Heart disease Maternal Grandfather   . Heart disease Paternal Grandmother   . Diabetes Paternal Grandfather   . Stroke Paternal Grandfather   . Cancer Maternal Aunt     breast  . Heart disease Paternal  Uncle   . Diabetes Paternal Uncle   . Stroke Paternal Uncle     Past Surgical History:  Procedure Laterality Date  . CHOLECYSTECTOMY N/A 08/07/2013   Procedure: LAPAROSCOPIC CHOLECYSTECTOMY;  Surgeon: Robyne AskewPaul S Toth III, MD;  Location: [facility] OR;  Service: General;  Laterality: N/A;  . CLEFT LIP REPAIR     Multiple  . CLEFT PALATE REPAIR     Multiple  . ROTATOR CUFF REPAIR  2010   left  . 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 Smoking status: Former Smoker    Packs/day: 0.10    Years: 1.00    Types: Cigarettes    Start date: 04/10/2012    Quit date: 08/07/2013  . Smokeless tobacco: Never Used  . Alcohol use No  . Drug use: No  . Sexual activity: Not Currently

## 2016-10-02 NOTE — Telephone Encounter (Signed)
Patient has an appt on 10/04/2016 with Dr Frances FurbishAthar.

## 2016-10-02 NOTE — Telephone Encounter (Signed)
I talked with pt over the phone. She still has neck tremors and not changed from last visit. She saw orthopedics today for neck pain and MRI C-spine was ordered. She will have MRI brain and C-spine on 10/11/16. I reminded her about her incoming appointment with Dr. Frances FurbishAthar in 2 days. She did not tolerate benadryl and has discontinued it. She is still on clonazepam but not effective.   I would like to see what Dr. Frances FurbishAthar thinks about her condition first and to see if Dr. Frances FurbishAthar would like to see her a couple of time before sending pt back to me. I will then make appointment at that time with the pt.   Marvel PlanJindong Satoshi Kalas, MD PhD Stroke Neurology 10/02/2016 4:12 PM

## 2016-10-03 ENCOUNTER — Telehealth: Payer: Self-pay

## 2016-10-03 NOTE — Telephone Encounter (Signed)
Rn call patient about her lab work. Rn stated her lab work was unremarkable. Pt verbalized understanding. Rn remind pt of her appt tomorrow with Dr. Frances FurbishAthar at 1030am. Rn reminded pt to check in at 1000am.

## 2016-10-03 NOTE — Telephone Encounter (Signed)
-----   Message from Marvel PlanJindong Xu, MD sent at 10/02/2016  6:21 PM EST ----- Could you please let the patient know that the blood test done recently in our office was unremarkable. Please follow up with Dr. Frances FurbishAthar on 10/04/16. Thanks.  Marvel PlanJindong Xu, MD PhD Stroke Neurology 10/02/2016 6:21 PM

## 2016-10-04 ENCOUNTER — Ambulatory Visit (INDEPENDENT_AMBULATORY_CARE_PROVIDER_SITE_OTHER): Payer: Self-pay | Admitting: Neurology

## 2016-10-04 ENCOUNTER — Encounter: Payer: Self-pay | Admitting: Neurology

## 2016-10-04 VITALS — BP 124/66 | HR 80 | Resp 16 | Ht 63.0 in | Wt 167.0 lb

## 2016-10-04 DIAGNOSIS — R259 Unspecified abnormal involuntary movements: Secondary | ICD-10-CM

## 2016-10-04 DIAGNOSIS — F8081 Childhood onset fluency disorder: Secondary | ICD-10-CM

## 2016-10-04 NOTE — Progress Notes (Signed)
Subjective:    Patient ID: [name] is a 45 y.o. female.  HPI     Rebecca FoleySaima Jeda Pardue, MD, PhD [facility] [address], [address] [address] [place], [place] [address]  Dear Rebecca MartenJindong,   I saw your patient, [name], upon your kind request in my clinic today for second opinion of abnormal involuntary movements, in particular tremor of the left side. The patient is accompanied by her husband today. As you know, [name] is a 45 year old right-handed woman with an underlying medical history of migraine headaches, reflux disease, depression, anxiety, anemia, chronic neck pain, status post cleft lip and palate repair, who reports a new onset tremor in her head and left arm for the past month. The tremor started fairly abruptly, prior to this she had used a muscle relaxer, Soma and had also used Phenergan, medications were stopped but tremor persisted. She feels that her tremor may have started after she had rotator cuff surgery on the left some 6 or 7 years ago. Per patient, she had an incident on 08/30/16: she was watching TV with husband and suddenly had L arm and hand tremor, which traveled up the arm, then L shoulder and neck. The episode lasted for about 45 minutes, during which time she was fully alert and responsive per husband. She had no tongue bite, no bowel or bladder incontinence, no altered mental status, no post ictal period. She has had persistent neck involuntary movements, trembling, stuttering. Of note, she has had recent increase in stressors. She has a remote history of depression and being on antidepressant medication. She denies depression and does not want to try any antidepressant. She does admit to anxiety. More recently, she had stress related to her oldest son who had to have back surgery. In addition, they have had financial stress what with her husband's decline in work. About 3 weeks ago she was switched from Xanax 1 mg 3 times a day to  clonazepam per PCP as I understand. She was on Xanax for quite some time and in the past had tried clonazepam before but had side effects including excessive sedation which she has experienced again. She is currently on Flexeril for neck pain but it is not clear if it is helpful. She also suffers from recurrent migrainous headaches and other types of head pain. I reviewed your office note from 09/26/2016. She was started on a trial of clonazepam. She is scheduled to have a brain MRI next week. She had blood work including TSH, CBC without differential, heavy metal screen and blood, CK level, BMP and B12, and I reviewed the results, labs were unremarkable. Of note, she had a brain MRI with and without contrast as well as head MRA without contrast in October 2010 but I could not pull up report or the images. She had a brain MRI with and without contrast in July 2007 as well as a head MRA without contrast and I reviewed the report: IMPRESSION: 1.  No acute infarct or abnormal intracranial enhancing lesion.  2.  Minimal nonspecific white matter type changes. MR ANGIOGRAPHY OF HEAD: IMPRESSION: Small focal bulges left internal carotid artery and right posterior cerebral artery as described above. Small aneurysms cannot be excluded.   Her Past Medical History Is Significant For: Past Medical History:  Diagnosis Date  . Anemia   . Anxiety   . Chest pain    a. Felt noncardiac with normal stress echo 11/2012.  Marland Kitchen. Cholelithiasis    a.  on Abd US 11/2012.  Marland Kitchen. Complication of anesthesia   . DepresKoreasion   . GERD (gastroesophageal reflux disease)   . Migraines   . PONV (postoperative nausea and vomiting)   . Radiculopathy of cervical region     Her Past Surgical History Is Significant For: Past Surgical History:  Procedure Laterality Date  . CHOLECYSTECTOMY N/A 08/07/2013   Procedure: LAPAROSCOPIC CHOLECYSTECTOMY;  Surgeon: Robyne AskewPaul S Toth III, MD;  Location: [facility] OR;  Service: General;  Laterality: N/A;  .  CLEFT LIP REPAIR     Multiple  . CLEFT PALATE REPAIR     Multiple  . ROTATOR CUFF REPAIR  2010   left  . TUBAL LIGATION  1998    Her Family History Is Significant For: Family History  Problem Relation Age of Onset  . Migraines Mother   . Heart disease Father   . Cancer Maternal Grandmother     ovarian  . Heart disease Maternal Grandmother   . Stroke Maternal Grandmother   . Heart disease Maternal Grandfather   . Heart disease Paternal Grandmother   . Diabetes Paternal Grandfather   . Stroke Paternal Grandfather   . Cancer Maternal Aunt     breast  . Heart disease Paternal Uncle   . Diabetes Paternal Uncle   . Stroke Paternal Uncle     Her Social History Is Significant For: Social History   Social History  . Marital status: Married    Spouse name: N/A  . Number of children: N/A  . Years of education: N/A   Occupational History  . Packer at [employer]    Social History Main Topics  . Smoking status: Current Some Day Smoker    Packs/day: 0.10    Years: 1.00    Types: Cigarettes    Start date: 04/10/2012    Last attempt to quit: 08/07/2013  . Smokeless tobacco: Never Used  . Alcohol use No  . Drug use: No  . Sexual activity: Not Currently   Other Topics Concern  . None   Social History Narrative   Occasionally drinks caffeine beverage     Her Allergies Are:  Allergies  Allergen Reactions  . Other Nausea And Vomiting    "sleeping gas"= makes me vomit  (even when a sleep)  . Latex Hives, Itching and Rash  . Amoxicillin Nausea Only  . Iohexol      Desc: Pt had throat tightness, slight swelling. Itchy eyes and itchy gums.  Dr Molli PoseyMansell spoke w/ pt.  Treating this like possible/early reaction.  Pt was counseled. Needs full premeds w/ IV contrast, txs., Onset Date: [id number]   . Doxycycline Rash  . Erythromycin Rash  :   Her Current Medications Are:  Outpatient Encounter Prescriptions as of 10/04/2016  Medication Sig  . Cholecalciferol (D3 ADULT PO)  Take 50,000 Units by mouth.  . clonazePAM (KLONOPIN) 0.5 MG tablet Take 1 tablet (0.5 mg total) by mouth 2 (two) times daily.  . cyclobenzaprine (FLEXERIL) 10 MG tablet Take 10 mg by mouth 3 (three) times daily as needed for muscle spasms.  Marland Kitchen. HYDROcodone-acetaminophen (NORCO/VICODIN) 5-325 MG tablet Take 1 tablet by mouth every 6 (six) hours as needed for moderate pain.  . valsartan (DIOVAN) 80 MG tablet TAKE ONE TABLET BY MOUTH ONCE DAILY FOR BLOOD PRESSURE  . [DISCONTINUED] diphenhydrAMINE (BENADRYL) 25 MG tablet Take 1 tablet (25 mg total) by mouth 2 (two) times daily. (Patient not taking: Reported on 10/02/2016)   No facility-administered encounter medications on file  as of 10/04/2016.   : Review of Systems:  Out of a complete 14 point review of systems, all are reviewed and negative with the exception of these symptoms as listed below:  Review of Systems  Neurological:       Patient states that since the end of last month she has developed a tremor in her head and L arm. Reports having a slight tremor in L arm after shoulder surgery 5-6 years ago.     Objective:  Neurologic Exam  Physical Exam Physical Examination:   Vitals:   10/04/16 1008  BP: 124/66  Pulse: 80  Resp: 16    General Examination: The patient is a very pleasant 45 y.o. female in no acute distress. She appears well-developed and well-nourished and adequately groomed.   HEENT: Normocephalic, atraumatic, pupils are equal, round and reactive to light and accommodation. Extraocular tracking is good without limitation to gaze excursion or nystagmus noted. Normal smooth pursuit is noted. Hearing is grossly intact. Face is symmetric with normal facial animation and normal facial sensation. Speech is clear with no dysarthria noted. There is no hypophonia. There is no lip, or voice tremor. She has intermittent stuttering which is variable. Oropharynx exam reveals: moderate mouth dryness, adequate dental hygiene and mild  airway crowding. Mallampati is class II. Tongue protrudes centrally and palate elevates symmetrically. She has involuntary neck and head movements which are in various directions, somewhat distractible, and not stereotypical, she has no sustained abnormal neck or shoulder contractions, no dystonic appearance, no hypertrophy of her neck muscles, neck is supple with full range of passive mobility, mild limitation to neck turns to both sides, actively. Shoulder strength is unremarkable bilaterally and shoulder height is equal.   Chest: Clear to auscultation without wheezing, rhonchi or crackles noted.  Heart: S1+S2+0, regular and normal without murmurs, rubs or gallops noted.   Abdomen: Soft, non-tender and non-distended with normal bowel sounds appreciated on auscultation.  Extremities: There is no pitting edema in the distal lower extremities bilaterally. Pedal pulses are intact.  Skin: Warm and dry without trophic changes noted.  Musculoskeletal: exam reveals no obvious joint deformities, tenderness or joint swelling or erythema.   Neurologically:  Mental status: The patient is awake, alert and oriented in all 4 spheres. Her immediate and remote memory, attention, language skills and fund of knowledge are appropriate. There is no evidence of aphasia, agnosia, apraxia or anomia. Thought process is linear. Mood is constricted and affect is blunted.  Cranial nerves II - XII are as described above under HEENT exam. In addition: shoulder shrug is normal with equal shoulder height noted. Motor exam: Normal bulk, strength and tone is noted. There is no drift, or rebound.  On Archimedes spiral drawing she has a minimal tremor on the left, handwriting is legible, not micrographic, not tremulous. She has no resting tremor, she has a minimal postural tremor, left more than right, no significant action tremor and no intention tremor. Romberg is negative. Reflexes are 1+. On fine motor skills testing she has  skills an intact finger taps and rapid alternating patting. Upon tapping her with them, her neck involuntary movements are distractible and decrease in intensity. Finger to nose is unremarkable bilaterally. Sensory exam: intact to light touch.  Gait, station and balance: She stands easily. No veering to one side is noted. No leaning to one side is noted. Posture is age-appropriate and stance is narrow based. Gait shows normal stride length, slightly cautious gait and slow gait, on tandem walk  she has sway but no tendency to fall, involuntary movements in her head and neck area tend to be less with tandem walk as well.  Assessment and Plan:   In summary, [name] is a very pleasant 45 y.o.-year old female with an underlying medical history of migraine headaches, reflux disease, depression, anxiety, anemia, chronic neck pain, status post cleft lip and palate repair, who presents for second opinion of a fairly acute onset of abnormal involuntary movements affecting her head and neck area primarily, and her left upper extremity. On examination, she has no telltale signs of dystonia, asterixis, athetoid movements, Corey for movements, essential tremor type changes or parkinsonism. I had a fairly long discussion with the patient and her husband about her presentation and her clinical course thus far. Workup thus far has been reassuring in terms of blood work, she has a brain MRI pending for next week. Given the clinical presentation of variable movements, lack of stereotypical movement, and presence of distractibility, the concern for functional movement disorder is high. Nevertheless, an organic underlying structural cause in her brain should be excluded and I agree with pursuing a brain scan, furthermore, you can certainly also consider a EEG if you deem feasible. From my end of things, I suggested that she talk to you about potentially tapering off the clonazepam, she should also discuss this with her  primary care physician as he started the clonazepam from what I understand. Furthermore, she is encouraged to talk to her PCP about other means to treat her anxiety. She vehemently denies depression. She does admit to having had recent stressors which could be a significant  with a his contributor to her presentation. I suggested she schedule a follow-up with you after her test next week is completed. From my end of things I did not suggest any new medications.  Thank you very much for allowing me to participate in the care of this nice patient. If I can be of any further assistance to you please do not hesitate to talk to me.   Sincerely,   Rebecca Foley, MD, PhD

## 2016-10-04 NOTE — Patient Instructions (Addendum)
From my end of things:   I don't see any signs of parkinsonism or Parkinson's disease, tremor or seizure or what we call dystonia.  I do believe, doing the MRI brain will help exclude a structural problem in your brain and Dr. Roda ShuttersXu can consider doing an EEG (brainwave test) as well. I will discuss with him. Please make a follow up appointment after the brain MRI with Dr. Roda ShuttersXu. I would like to suggest that you taper off the Clonazepam.  I would recommend no new medication.   You may benefit from seeing a specialist for your anxiety; talk to Dr. Clarene DukeLittle about this.

## 2016-10-11 ENCOUNTER — Ambulatory Visit
Admission: RE | Admit: 2016-10-11 | Discharge: 2016-10-11 | Disposition: A | Discharge status: Home / Self Care | Payer: No Typology Code available for payment source | Source: Ambulatory Visit | Attending: Neurology | Admitting: Neurology

## 2016-10-11 DIAGNOSIS — R251 Tremor, unspecified: Secondary | ICD-10-CM

## 2016-10-11 DIAGNOSIS — Z77018 Contact with and (suspected) exposure to other hazardous metals: Secondary | ICD-10-CM

## 2016-10-11 MED ORDER — GADOBENATE DIMEGLUMINE 529 MG/ML IV SOLN
15.0000 mL | Freq: Once | INTRAVENOUS | Status: AC | PRN
  Administered 2016-10-11: 15 mL via INTRAVENOUS

## 2016-10-13 ENCOUNTER — Ambulatory Visit
Admission: RE | Admit: 2016-10-13 | Discharge: 2016-10-13 | Disposition: A | Discharge status: Home / Self Care | Payer: No Typology Code available for payment source | Source: Ambulatory Visit | Attending: Orthopaedic Surgery | Admitting: Orthopaedic Surgery

## 2016-10-13 DIAGNOSIS — M542 Cervicalgia: Secondary | ICD-10-CM

## 2016-10-17 ENCOUNTER — Ambulatory Visit (INDEPENDENT_AMBULATORY_CARE_PROVIDER_SITE_OTHER): Payer: Self-pay | Admitting: Orthopaedic Surgery

## 2016-10-17 ENCOUNTER — Encounter (INDEPENDENT_AMBULATORY_CARE_PROVIDER_SITE_OTHER): Payer: Self-pay | Admitting: Orthopaedic Surgery

## 2016-10-17 VITALS — BP 111/87 | HR 68 | Ht 63.0 in | Wt 163.0 lb

## 2016-10-17 DIAGNOSIS — M542 Cervicalgia: Secondary | ICD-10-CM

## 2016-10-17 DIAGNOSIS — G8929 Other chronic pain: Secondary | ICD-10-CM

## 2016-10-17 NOTE — Progress Notes (Deleted)
   Office Visit Note   Patient: [name]           Date of Birth: 09/03/1971           MRN: [id number] Visit Date: 10/17/2016              Requested by: Aida PufferJames Little, MD [address] [place], [place] [address] PCP: Aida PufferLITTLE,JAMES, MD   Assessment & Plan: Visit Diagnoses: No diagnosis found.  Plan: ***  Follow-Up Instructions: No Follow-up on file.   Orders:  No orders of the defined types were placed in this encounter.  No orders of the defined types were placed in this encounter.     Procedures: No procedures performed   Clinical Data: No additional findings.   Subjective: No chief complaint on file.   Pt here today to go over MRI results. Pt left shoulder has been hurting more than usual, "probably due to the shaking".      Review of Systems   Objective: Vital Signs: There were no vitals taken for this visit.  Physical Exam  Ortho Exam  Specialty Comments:  No specialty comments available.  Imaging: No results found.   PMFS History: Patient Active Problem List   Diagnosis Date Noted  . Tremor 09/26/2016  . Chronic constipation 02/03/2014  . Arthritis 02/03/2014  . Tobacco use 12/11/2012  . Migraine headache 01/10/2007   Past Medical History:  Diagnosis Date  . Anemia   . Anxiety   . Chest pain    a. Felt noncardiac with normal stress echo 11/2012.  Marland Kitchen. Cholelithiasis    a. on Abd US 11/2012.  Marland Kitchen. Complication of anesthesia   . Depression   . GERD (gastroesophageal reflux disease)   . Migraines   . PONV (postoperative nausea and vomiting)   . Radiculopathy of cervical region     Family History  Problem Relation Age of Onset  . Migraines Mother   . Heart disease Father   . Cancer Maternal Grandmother     ovarian  . Heart disease Maternal Grandmother   . Stroke Maternal Grandmother   . Heart disease Maternal Grandfather   . Heart disease Paternal Grandmother   . Diabetes Paternal Grandfather   . Stroke Paternal Grandfather   .  Cancer Maternal Aunt     breast  . Heart disease Paternal Uncle   . Diabetes Paternal Uncle   . Stroke Paternal Uncle     Past Surgical History:  Procedure Laterality Date  . CHOLECYSTECTOMY N/A 08/07/2013   Procedure: LAPAROSCOPIC CHOLECYSTECTOMY;  Surgeon: Robyne AskewPaul S Toth III, MD;  Location: [facility] OR;  Service: General;  Laterality: N/A;  . CLEFT LIP REPAIR     Multiple  . CLEFT PALATE REPAIR     Multiple  . ROTATOR CUFF REPAIR  2010   left  . TUBAL LIGATION  1998

## 2016-10-17 NOTE — Progress Notes (Signed)
Office Visit Note   Patient: [name]           Date of Birth: 08/10/1971           MRN: [id number] Visit Date: 10/17/2016              Requested by: Rebecca PufferJames Little, MD [address] [place], [place] [address] PCP: Rebecca PufferLITTLE,JAMES, MD   Assessment & Plan: Visit Diagnoses:  1. 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 [facility] She'll follow-up with the neurologist next week  Follow-Up Instructions: Return in about 1 month (around 11/17/2016).   Orders:  Orders Placed This Encounter  Procedures  . Ambulatory referral to Physical Therapy   No orders of the defined types were placed in this encounter.     Procedures: No procedures performed   Clinical Data: No additional findings.  CC MRI scan report with degenerative changes and cervical spine. There is C5-6 moderate left foraminal narrowing from uncovertebral spur Subjective: No chief complaint on file.   HPI [name] continues to have pain in the cervical spine with some referred discomfort to the left upper extremity. She is not had an injury or trauma. She also had rather acute onset of tremors and is being followed by the neurologist. She just had a recent MRI scan of the brain and the cervical spine. Symptoms have not changed  Review of Systems   Objective: Vital Signs: BP 111/87   Pulse 68   Ht 5\' 3"  (1.6 m)   Wt 163 lb (73.9 kg)   BMI 28.87 kg/m   Physical Exam  Ortho Exam Limited range of motion of cervical spine. Lacks a fingerbreadth touching chin to chest. Approximately 60% of normal neck extension without referred pain. There is some discomfort referred to the left upper extremity on rotation of the neck to the left but not the right. Good grip and release right upper extremity. Specialty Comments:  No specialty comments  available.  Imaging: No results found.   PMFS History: Patient Active Problem List   Diagnosis Date Noted  . Tremor 09/26/2016  . Chronic constipation 02/03/2014  . Arthritis 02/03/2014  . Tobacco use 12/11/2012  . Migraine headache 01/10/2007   Past Medical History:  Diagnosis Date  . Anemia   . Anxiety   . Chest pain    a. Felt noncardiac with normal stress echo 11/2012.  Marland Kitchen. Cholelithiasis    a. on Abd US 11/2012.  Marland Kitchen. Complication of anesthesia   . Depression   . GERD (gastroesophageal reflux disease)   . Migraines   . PONV (postoperative nausea and vomiting)   . Radiculopathy of cervical region     Family History  Problem Relation Age of Onset  . Migraines Mother   . Heart disease Father   . Cancer Maternal Grandmother     ovarian  . Heart disease Maternal Grandmother   . Stroke Maternal Grandmother   . Heart disease Maternal Grandfather   . Heart disease Paternal Grandmother   . Diabetes Paternal Grandfather   . Stroke Paternal Grandfather   . Cancer Maternal Aunt     breast  . Heart disease Paternal Uncle   . Diabetes Paternal Uncle   . Stroke Paternal Uncle     Past Surgical History:  Procedure Laterality Date  .  CHOLECYSTECTOMY N/A 08/07/2013   Procedure: LAPAROSCOPIC CHOLECYSTECTOMY;  Surgeon: Robyne AskewPaul S Toth III, MD;  Location: [facility] OR;  Service: General;  Laterality: N/A;  . CLEFT LIP REPAIR     Multiple  . CLEFT PALATE REPAIR     Multiple  . ROTATOR CUFF REPAIR  2010   left  . 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 Smoking status: Current Some Day Smoker    Packs/day: 0.10    Years: 1.00    Types: Cigarettes    Start date: 04/10/2012    Last attempt to quit: 08/07/2013  . Smokeless tobacco: Never Used  . Alcohol use No  . Drug use: No  . Sexual activity: Not Currently

## 2016-10-18 ENCOUNTER — Telehealth: Payer: Self-pay

## 2016-10-18 NOTE — Telephone Encounter (Signed)
-----   Message from Marvel PlanJindong Xu, MD sent at 10/14/2016  7:53 AM EST ----- Could you please let the patient know that the MRI brain test done recently in our office was unremarkable. No concerns raised from MRI brain. Please continue current treatment. Thanks.  Marvel PlanJindong Xu, MD PhD Stroke Neurology 10/14/2016 7:53 AM

## 2016-10-18 NOTE — Telephone Encounter (Signed)
Rn call patient about MRI brain test. Rn stated per Dr. Roda ShuttersXu note it was unremarkable. No concerns. Continue treatment plan. Patient verbalized understanding.

## 2016-10-23 ENCOUNTER — Encounter: Payer: Self-pay | Admitting: Neurology

## 2016-10-23 ENCOUNTER — Ambulatory Visit (INDEPENDENT_AMBULATORY_CARE_PROVIDER_SITE_OTHER): Payer: Self-pay | Admitting: Neurology

## 2016-10-23 ENCOUNTER — Ambulatory Visit: Payer: Self-pay | Admitting: Neurology

## 2016-10-23 VITALS — BP 115/70 | HR 65 | Ht 61.0 in | Wt 161.4 lb

## 2016-10-23 DIAGNOSIS — G43709 Chronic migraine without aura, not intractable, without status migrainosus: Secondary | ICD-10-CM

## 2016-10-23 DIAGNOSIS — F444 Conversion disorder with motor symptom or deficit: Secondary | ICD-10-CM

## 2016-10-23 NOTE — Progress Notes (Signed)
NEUROLOGY CLINIC FOLLOW UP PATIENT NOTE  NAME: [name] DOB: 06/12/1971  HPI: [name] is a 45 y.o. female with PMH of migraine who presents as a new patient for body tremor. She was accompanied by her husband.  She stated that she was at her usual health 2-3 weeks ago when she was watching TV and laughing and speaking, suddenly, husband witnessed that she started to have left hand tremor, spread to head, neck and then left arm and left leg, then "whold body", no LOC. It lasted about 15 min and then eased off some but continued to have left arm and neck tremor until now. The tremor fluctuates, stress makes it worse, neck postural movement makes it worse such as bending down and looking up. Lying down will ease off the tremor, and no tremor during sleep. She also felt weakness on the left arm but as per husband it is due to her previous shoulder hx a few years ago, no change this time.   4-5 days prior to this happening, she was given soma for her HA. She had been on soma in the past without problem. However, this time she did not feel well with soma. After 2-3 days of soma, she went back to flexeril before tremor started. She had been on Xanax for 2 years but it was stopped "cold [place]" last week. She is still on flexeril now. She has not taking hydrocodone as prescribe as it makes her sick. She also takes phenergan almost daily until last week for nausea due to HA, but not taking for a week.   She has long history of migraine HA. She saw Dr. Sandria ManlyLove here in 2010. Her HA is daily chronic HA now. In the past, she had typical migraine, 2 times per week, has to go to dark room and lock herself up, associated with N/V, used goody powder and exedrine, some effectiveness but not abort the HA. As per note, she had tried topamax, depakote, inderal, maxalt, botox injections and midrin for HA, but not effective. MRI in 2010 showed unremarkable except a solitary area of nonspecific WM hyperdensity in  the right parietal region. MRA negative.  She had one episode in 2007 with HA and slurry speech, concerning for stroke. [facility], Dr. Sandria ManlyLove saw her too at that time. As per pt, she had work up for stroke but was told not a stroke.   Interval history: During the interval time, she has been doing the same. She still has neck shaking and stuttering speech. She can not tolerate benadryl and clonazepam which made her feel sick. She was seen by Dr. Frances FurbishAthar and considered psychogenic movement. 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 Currently, she still has some anxiety and depressed mood. She admitted that she had hx of difficulty relationship with her ex-husband and now she is back with him but afraid of this happening again. She spends almost all her time at home and does not go out. She plays some video games which make her feel relaxed and comfortable. She has not had psychologist and psychiatrist for a while.   Past Medical History:  Diagnosis Date  . Anemia   . Anxiety   . Chest pain    a. Felt noncardiac with normal stress echo 11/2012.  Marland Kitchen. Cholelithiasis    a. on Abd US 11/2012.  Marland Kitchen. Complication of anesthesia   . Depression   .  GERD (gastroesophageal reflux disease)   . Migraines   . PONV (postoperative nausea and vomiting)   . Radiculopathy of cervical region    Past Surgical History:  Procedure Laterality Date  . CHOLECYSTECTOMY N/A 08/07/2013   Procedure: LAPAROSCOPIC CHOLECYSTECTOMY;  Surgeon: Robyne Askew, MD;  Location: [facility] OR;  Service: General;  Laterality: N/A;  . CLEFT LIP REPAIR     Multiple  . CLEFT PALATE REPAIR     Multiple  . ROTATOR CUFF REPAIR  2010   left  . TUBAL LIGATION  1998   Family History  Problem Relation Age of Onset  . Migraines Mother   . Heart disease Father   . Cancer Maternal Grandmother     ovarian  . Heart disease Maternal Grandmother   . Stroke Maternal Grandmother   .  Heart disease Maternal Grandfather   . Heart disease Paternal Grandmother   . Diabetes Paternal Grandfather   . Stroke Paternal Grandfather   . Cancer Maternal Aunt     breast  . Heart disease Paternal Uncle   . Diabetes Paternal Uncle   . Stroke Paternal Uncle    Current Outpatient Prescriptions  Medication Sig Dispense Refill  . Cholecalciferol (D3 ADULT PO) Take 50,000 Units by mouth.    . valsartan (DIOVAN) 80 MG tablet TAKE ONE TABLET BY MOUTH ONCE DAILY FOR BLOOD PRESSURE  5   No current facility-administered medications for this visit.    Allergies  Allergen Reactions  . Other Nausea And Vomiting    "sleeping gas"= makes me vomit  (even when a sleep)  . Latex Hives, Itching and Rash  . Amoxicillin Nausea Only  . Iohexol      Desc: Pt had throat tightness, slight swelling. Itchy eyes and itchy gums.  Dr Molli Posey spoke w/ pt.  Treating this like possible/early reaction.  Pt was counseled. Needs full premeds w/ IV contrast, txs., Onset Date: [id number]   . Doxycycline Rash  . Erythromycin Rash   Social History   Social History  . Marital status: Married    Spouse name: N/A  . Number of children: N/A  . Years of education: N/A   Occupational History  . Packer at [employer]    Social History Main Topics  . Smoking status: Current Some Day Smoker    Packs/day: 0.10    Years: 1.00    Types: Cigarettes    Start date: 04/10/2012    Last attempt to quit: 08/07/2013  . Smokeless tobacco: Never Used  . Alcohol use No  . Drug use: No  . Sexual activity: Not Currently   Other Topics Concern  . Not on file   Social History Narrative   Occasionally drinks caffeine beverage     Review of Systems Full 14 system review of systems performed and notable only for those listed, all others are neg:  Constitutional:  Fatigue, weight loss Cardiovascular:  Ear/Nose/Throat:   Skin:  Eyes:  Blurry vision Respiratory:  snoring Gastroitestinal:  constipation Genitourinary:   Hematology/Lymphatic:   Endocrine: feeling hot, increased thirst Musculoskeletal:  Joint pain, cramps, aching muscles Allergy/Immunology:  Allergies, runny nose Neurological:  Memory loss, confusion, HA, numbness, weakness, slurry speech, dizziness, tremor Psychiatric: not enough sleep, decreased energy, disinterest in activities Sleep: insomnia, restless leg   Physical Exam  Vitals:   10/23/16 1004  BP: 115/70  Pulse: 65    General - Well nourished, well developed, discomfort with neck tremor.  Ophthalmologic - not able to test due  to neck tremor.  Cardiovascular - Regular rate and rhythm with no murmur.   Neck - constant neck shaking/tremoring   Mental Status -  Level of arousal and orientation to time, place, and person were intact. Language including expression, naming, repetition, comprehension found intact, however, stuttering speech  Attention span and concentration were normal. Recent and remote memory were intact. Fund of Knowledge was assessed and was intact.  Cranial Nerves II - XII - II - Visual field intact OU. III, IV, VI - Extraocular movements intact. V - Facial sensation intact bilaterally. VII - Facial movement intact bilaterally. VIII - Hearing & vestibular intact bilaterally. X - Palate elevates symmetrically, no palate myoclonus. XI - Chin turning & shoulder shrug intact bilaterally. XII - Tongue protrusion intact.  Motor Strength - The patient's strength was normal in all extremities and pronator drift was absent.  Bulk was normal and fasciculations were absent.   Motor Tone - Muscle tone was assessed at the neck and appendages and was normal.  Reflexes - The patient's reflexes were normal in all extremities and she had no pathological reflexes.  Sensory - Light touch, temperature/pinprick were assessed and were normal.    Coordination - The patient had bilateral FTN mild dysmetria could be due to neck tremor   Gait and Station - cautious  gait due to neck tremor.   Imaging  I have personally reviewed the radiological images below and agree with the radiology interpretations.  05/2006 MRI and MRA IMPRESSION: 1.  No acute infarct or abnormal intracranial enhancing lesion.  2.  Minimal nonspecific white matter type changes. 3.  Small focal bulges left internal carotid artery and right posterior cerebral artery as described above.  Small aneurysms cannot be excluded.  08/2009 MRI and MRA MRI scan of the brain shows a solitary area of nonspecific WM hyperdensity in the right parietal periventricular region.  MRA of the brain showed no significant stenosis on a medium-to-large size intracranial vessels.   11/2012 stress test Normal study after maximal exercise. Good exercise tolerance. Patient had some chest tightness on treadmill but no evidence for ischemia by ECG or echo.  Mr Cervical Spine W/o Contrast  10/13/2016 IMPRESSION: 1. No evidence of myelopathy. 2. Upper cervical facet arthropathy and mid cervical disc degeneration as described. 3. 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 C5-6 moderate left foraminal narrowing from uncovertebral spur. 4. 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  MRI brian 10/12/16 1. No evidence of myelopathy. 2. Upper cervical facet arthropathy and mid cervical disc degeneration as described. 3. 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 C5-6 moderate left foraminal narrowing from uncovertebral spur. 4. 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   Lab Review Component     Latest Ref Rng & Units 09/26/2016  Lead, Blood     0 - 19 ug/dL None Detected  Arsenic, Blood     2 - 23 ug/L 8  Mercury     0.0 - 14.9 ug/L None Detected  TSH     0.450 - 4.500 uIU/mL 1.140  T4,Free(Direct)     0.82 - 1.77 ng/dL 2.951.34  CK Total     24 - 173 U/L 52  Vitamin B12     211 - 946 pg/mL 390      Assessment:   In summary, [name] is a 45 y.o. female with PMH of migraine HA  presents new acute onset neck and hand tremor, more prominent at neck with neck posture. She denies any illicit  drug, but did take soma and phenergan before this happened which made her feel ill although she had those medications in the past. However, the tremor continued after medication discontinued. MRI brian with and without contrast and C-spine unremarkable. Saw by Dr. Frances Furbish and consider psychogenic movement. Pt does have relationship issue with ex-husband, especially in the past. Will need psychology referral.   Plan: - relaxation and de-stress are the key to improve - refer to psychologist for counseling - follow up with PCP regularly - follow up in 3 months   I spent more than 25 minutes of face to face time with the patient. Greater than 50% of time was spent in counseling and coordination of care. 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  Orders Placed This Encounter  Procedures  . Ambulatory referral to Psychology    Referral Priority:   Routine    Referral Type:   Psychiatric    Referral Reason:   Specialty Services Required    Requested Specialty:   Psychology    Number of Visits Requested:   1    No orders of the defined types were placed in this encounter.   Patient Instructions  - your neck shaking and stuttering speech are likely due to stress, anxiety and depression - relaxation and de-stress are the key to improve - recommend to do things your enjoyed to shift concentration - refer to psychologist for counseling - follow up with PCP regularly - follow up in 3 months    Marvel Plan, MD PhD [facility] [address] [place], [place] [address] [phone]

## 2016-10-23 NOTE — Patient Instructions (Signed)
-   your neck shaking and stuttering speech are likely due to stress, anxiety and depression - relaxation and de-stress are the key to improve - recommend to do things your enjoyed to shift concentration - refer to psychologist for counseling - follow up with PCP regularly - follow up in 3 months

## 2016-10-30 ENCOUNTER — Ambulatory Visit (INDEPENDENT_AMBULATORY_CARE_PROVIDER_SITE_OTHER): Payer: Self-pay | Admitting: Orthopaedic Surgery

## 2016-11-17 ENCOUNTER — Ambulatory Visit (INDEPENDENT_AMBULATORY_CARE_PROVIDER_SITE_OTHER): Payer: Self-pay | Admitting: Orthopaedic Surgery

## 2016-11-17 DIAGNOSIS — G8929 Other chronic pain: Secondary | ICD-10-CM

## 2016-11-17 DIAGNOSIS — M545 Low back pain: Secondary | ICD-10-CM

## 2016-11-17 DIAGNOSIS — M542 Cervicalgia: Secondary | ICD-10-CM

## 2016-11-17 NOTE — Progress Notes (Signed)
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  [facility]. Follow-up in one month if no improvement  Follow-Up Instructions: No Follow-up on file.   Orders:  No orders of the defined types were placed in this encounter.  No orders of the defined types were placed in this encounter.     Procedures: No procedures performed   Clinical Data: No additional findings.   Subjective: Chief Complaint  Patient presents with  . Neck - Pain, Numbness    Pt still having neck pain, and lumbar pain.  Mariany relates that she has been followed by her neurologist who suggested that her parameters and speech abnormalities were related to anxiety. Occasionally the tremors and speech problems have resolved. She is having less trouble with her neck but still some stiffness. He denies numbness or tingling to either upper extremity although one occasion she has some "pain"  She also is been experiencing some low back pain with occasional discomfort to the right thigh. She denies numbness or tingling. Pain is not constant.  Review of Systems   Objective: Vital Signs: There were no vitals taken for this visit.  Physical Exam  Ortho Exam straight leg raise negative bilaterally. Reflexes are symmetrical. Painless range of motion of both hips and knees. Neurovascular exam intact to both feet. No percussible tenderness of lumbar spine.  Some limitation of motion of cervical spine but definitely improved since last visit. Able to touch chin to chest. Asked approximately 30 to full neck extension without referred pain. Approximately  50-60 of rotation to the right and to the left. No pain with range of motion of either shoulder. Good grip and good release distally .  Ronald has the tremors or speech impediment.  Specialty Comments:  No specialty comments available.  Imaging: No results found.   PMFS History: Patient Active Problem List   Diagnosis Date Noted  . 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 Tremor 09/26/2016  . Chronic constipation 02/03/2014  . Arthritis 02/03/2014  . Tobacco use 12/11/2012  . Migraine headache 01/10/2007   Past Medical History:  Diagnosis Date  . Anemia   . Anxiety   . Chest pain    a. Felt noncardiac with normal stress echo 11/2012.  Marland Kitchen. Cholelithiasis    a. on Abd US 11/2012.  Marland Kitchen. Complication of anesthesia   . Depression   . GERD (gastroesophageal reflux disease)   . Migraines   . PONV (postoperative nausea and vomiting)   . Radiculopathy of cervical region     Family History  Problem Relation Age of Onset  . Migraines Mother   . Heart disease Father   . Cancer Maternal Grandmother     ovarian  . Heart disease Maternal Grandmother   . Stroke Maternal Grandmother   . Heart disease Maternal Grandfather   . Heart disease Paternal Grandmother   . Diabetes Paternal Grandfather   . Stroke Paternal Grandfather   . Cancer Maternal Aunt     breast  .  Heart disease Paternal Uncle   . Diabetes Paternal Uncle   . Stroke Paternal Uncle     Past Surgical History:  Procedure Laterality Date  . CHOLECYSTECTOMY N/A 08/07/2013   Procedure: LAPAROSCOPIC CHOLECYSTECTOMY;  Surgeon: Robyne Askew, MD;  Location: [facility] OR;  Service: General;  Laterality: N/A;  . CLEFT LIP REPAIR     Multiple  . CLEFT PALATE REPAIR     Multiple  . ROTATOR CUFF REPAIR  2010   left  . TUBAL LIGATION  1998   Social History   Occupational History  . Packer at [employer]    Social History Main Topics  . Smoking status: Current Some Day Smoker    Packs/day: 0.10    Years: 1.00    Types:  Cigarettes    Start date: 04/10/2012    Last attempt to quit: 08/07/2013  . Smokeless tobacco: Never Used  . Alcohol use No  . Drug use: No  . Sexual activity: Not Currently

## 2016-11-24 ENCOUNTER — Ambulatory Visit: Payer: Self-pay | Attending: Orthopaedic Surgery | Admitting: Physical Therapy

## 2016-11-24 ENCOUNTER — Ambulatory Visit: Payer: Self-pay | Admitting: Rehabilitation

## 2016-11-24 ENCOUNTER — Encounter: Payer: Self-pay | Admitting: Physical Therapy

## 2016-11-24 DIAGNOSIS — G8929 Other chronic pain: Secondary | ICD-10-CM | POA: Insufficient documentation

## 2016-11-24 DIAGNOSIS — M542 Cervicalgia: Secondary | ICD-10-CM | POA: Insufficient documentation

## 2016-11-24 DIAGNOSIS — M545 Low back pain: Secondary | ICD-10-CM | POA: Insufficient documentation

## 2016-11-24 DIAGNOSIS — R293 Abnormal posture: Secondary | ICD-10-CM | POA: Insufficient documentation

## 2016-11-24 DIAGNOSIS — M6281 Muscle weakness (generalized): Secondary | ICD-10-CM | POA: Insufficient documentation

## 2016-11-24 DIAGNOSIS — M62830 Muscle spasm of back: Secondary | ICD-10-CM | POA: Insufficient documentation

## 2016-11-24 NOTE — Therapy (Addendum)
[facility] [address] [place], [place], [address] Phone: [phone]   Fax:  [phone]  Physical Therapy Evaluation  Patient Details  Name: [name] MRN: [id number] Date of Birth: 10/20/1971 Referring Provider: Valeria BatmanPeter W Whitfield MD  Encounter Date: 11/24/2016      PT End of Session - 11/24/16 1017    Visit Number 1   Number of Visits 17   Date for PT Re-Evaluation 01/19/17   PT Start Time 0930   PT Stop Time 1018   PT Time Calculation (min) 48 min   Activity Tolerance Patient tolerated treatment well   Behavior [facility] assessed/performed      Past Medical History:  Diagnosis Date  . Anemia   . Anxiety   . Chest pain    a. Felt noncardiac with normal stress echo 11/2012.  Marland Kitchen. Cholelithiasis    a. on Abd US 11/2012.  Marland Kitchen. Complication of anesthesia   . Depression   . GERD (gastroesophageal reflux disease)   . Hypertension   . Migraines   . PONV (postoperative nausea and vomiting)   . Radiculopathy of cervical region     Past Surgical History:  Procedure Laterality Date  . CHOLECYSTECTOMY N/A 08/07/2013   Procedure: LAPAROSCOPIC CHOLECYSTECTOMY;  Surgeon: Robyne AskewPaul S Toth III, MD;  Location: [facility] OR;  Service: General;  Laterality: N/A;  . CLEFT LIP REPAIR     Multiple  . CLEFT PALATE REPAIR     Multiple  . ROTATOR CUFF REPAIR  2010   left  . TUBAL LIGATION  1998    There were no vitals filed for this visit.       Subjective Assessment - 11/24/16 0939    Subjective pt is a 46 y.o F with CC of neck and low back pain with RLE to the knee that started in August 2017 with report of tremors and difficulty with speech. worsening of burning in the low back since onset with ony pain. has intermittent numbness inthe RLE. has a hx of bowel and bladder but has occured before the low back pain. Neck pain has been going on for along time, with referral down the back. hx of migraines which could be from  the neck.  some N/T in the fingers. No PHMhx invovling low back or neck pain.    Limitations Sitting;Other (comment);Standing;Walking;Lifting  transitions getting in/out of bed   How long can you sit comfortably? prefers not to sit, occasionally be able to sit for an hour   How long can you stand comfortably? depends on activity sometimes and hour   How long can you walk comfortably? 30-45 min   Diagnostic tests MRI on the neck, no imaging on the back   Patient Stated Goals to feel better, improve health, ADLS without difficulty   Currently in Pain? Yes   Pain Score 5    Pain Location Neck   Pain Orientation Right   Pain Descriptors / Indicators Tightness;Sharp   Pain Type Chronic pain   Pain Onset More than a month ago   Pain Frequency Constant   Aggravating Factors  stress, doing too much   Pain Relieving Factors ice but only with migraines   Multiple Pain Sites Yes   Pain Score 8   Pain Location Back   Pain Orientation Right;Lower;Mid   Pain Descriptors / Indicators Sharp;Burning   Pain Type Chronic pain   Pain Radiating Towards referral to the R knee   Pain Onset  More than a month ago   Pain Frequency Constant   Aggravating Factors  getting in and out of bed, prolonged sitting, bending forward, jumping/ running (jarring of activity)   Pain Relieving Factors N/A            [facility] PT Assessment - 11/24/16 0952      Assessment   Medical Diagnosis neck/ low back pain with RLE referral   Referring Provider Valeria Batman MD   Onset Date/Surgical Date --  August for back and chronically for neck   Hand Dominance Right   Next MD Visit --  make one in 4 weeks   Prior Therapy yes  for L shoulder RCR     Precautions   Precautions None     Restrictions   Weight Bearing Restrictions No     Balance Screen   Has the patient fallen in the past 6 months No  pt unable to remember   Has the patient had a decrease in activity level because of a fear of falling?  No   Is  the patient reluctant to leave their home because of a fear of falling?  No     Home Tourist information centre manager residence   Living Arrangements Children;Other relatives   Available Help at Discharge Available PRN/intermittently   Type of Home House   Home Access Stairs to enter   Entrance Stairs-Number of Steps 6   Entrance Stairs-Rails Can reach both   Home Layout One level   Home Equipment Other (comment)  chair in the shower     Prior Function   Level of Independence Independent with basic ADLs   Vocation Unemployed   Leisure unable to say     Cognition   Overall Cognitive Status Within Functional Limits for tasks assessed     Observation/Other Assessments   Focus on Therapeutic Outcomes (FOTO)  68% limited  predicted 50% limited     Posture/Postural Control   Posture/Postural Control Postural limitations   Postural Limitations Rounded Shoulders;Forward head  R shoulder hike     ROM / Strength   AROM / PROM / Strength AROM;PROM;Strength     AROM   AROM Assessment Site Lumbar;Cervical   Cervical Flexion 42  pain at end range   Cervical Extension 42  worse pain during motion    Cervical - Right Side Bend 40  pain during motion   Cervical - Left Side Bend 30  pain at end range   Lumbar Flexion 78  pain at end range   Lumbar Extension 13  pain during movement   Lumbar - Right Side Bend 20  pain during motion   Lumbar - Left Side Bend 8  pain during motion worse going to the L     Strength   Strength Assessment Site Hip;Knee   Right/Left Hip Right;Left   Right Hip Flexion 3+/5   Right Hip Extension 3-/5   Right Hip ABduction 2/5   Right Hip ADduction 2+/5   Left Hip Flexion 3+/5   Left Hip Extension 2+/5   Left Hip ABduction 2/5   Left Hip ADduction 2+/5   Right/Left Knee Right;Left   Right Knee Flexion 4/5   Right Knee Extension 4+/5   Left Knee Flexion 4/5   Left Knee Extension 4-/5     Palpation   Spinal mobility hypomobility of  L1-L5 PA PAIVM   Palpation comment tightness in bil paraspinals, soreness at R PSIS. bil upper trap tightness with L>R.  Special Tests    Special Tests Lumbar   Lumbar Tests Prone Knee Bend Test;Straight Leg Raise     Prone Knee Bend Test   Findings Positive   Side --  bil   Comment hip flexor tightess  L >R     Straight Leg Raise   Findings Negative   Side  --  bil                           PT Education - 11/24/16 1016    Education provided Yes   Education Details evaluation findings, POC, goals, HEP wiht proper form/ rationale   Person(s) Educated Patient   Methods Explanation;Verbal cues;Handout   Comprehension Verbalized understanding;Verbal cues required          PT Short Term Goals - 11/24/16 1207      PT SHORT TERM GOAL #1   Title pt will be I with inital HEP (12/15/2016)   Time 3   Period Weeks   Status New     PT SHORT TERM GOAL #2   Title pt will be able to verbalize and demo proper lifting/ carrying mechanics to prevent and reduce low back pain (12/15/2016   Time 3   Period Weeks   Status New     PT SHORT TERM GOAL #3   Title she will increase lumbar mobility by >/= 10 degrees for flexion/ extension and 5 degrees for bil sidebending with </= 4/10 pain (12/15/2016)   Time 3   Period Weeks   Status New           PT Long Term Goals - 11/24/16 1208      PT LONG TERM GOAL #1   Title pt will be I with all HEP given as of last visit (01/19/2017)   Time 6   Period Weeks   Status New     PT LONG TERM GOAL #2   Title pt will be demonstrate decreased low back muscle spasm to decrease pain to </= 2/10 and increase functional trunk mobility (01/19/2017)   Time 6   Period Weeks   Status New     PT LONG TERM GOAL #3   Title pt will increase bil hip strength to >/= 4/5 with </= 2/10 pain to assist with endurance (01/19/2017)   Time 6   Period Weeks   Status New     PT LONG TERM GOAL #4   Title pt will be able to sit/ stand and walk  for >/= 30 minutes with </= 2/10 pain for functional enduracne required for ADLS and community ambulatory activities (01/19/2017)   Time 6   Period Weeks   Status New     PT LONG TERM GOAL #5   Title she will increase her FOTO score to </= 50% to demonstrate improvement in function at discharge (01/19/2017)   Time 6   Period Weeks   Status New               Plan - 11/24/16 1153    Clinical Impression Statement Mrs. Hutchins presents to OPPT as a low complexity evaluation with CC of neck and low back pain. limited back and neck mobility with pain during movements. She exhibits significant weakness in bil LE with. She would benefit from physical therapy to reduce muscle spasm, promote trunk/ neck mobility, increase LE strength to maximize her function by addressing the deficits.    Rehab Potential Good   PT Frequency  2x / week   PT Duration 6 weeks   PT Treatment/Interventions ADLs/Self Care Home Management;Cryotherapy;Electrical Stimulation;Iontophoresis 4mg /ml Dexamethasone;Moist Heat;Ultrasound;Therapeutic activities;Therapeutic exercise;Patient/family education;Dry needling;Taping;Manual techniques;Passive range of motion   PT Next Visit Plan assess review HEP, soft tissue work, DN?, bil LE strengthening, modalities PRN, SIJ testing? further assess neck   PT Home Exercise Plan chin tucks, upper trap stretch, LTR, pelvic tilt, hamstring stretching   Consulted and Agree with Plan of Care Patient      Patient will benefit from skilled therapeutic intervention in order to improve the following deficits and impairments:  Pain, Postural dysfunction, Improper body mechanics, Decreased endurance, Decreased activity tolerance, Decreased range of motion, Decreased strength, Increased fascial restricitons, Hypomobility  Visit Diagnosis: Chronic right-sided low back pain, with sciatica presence unspecified - Plan: PT plan of care cert/re-cert  Muscle weakness (generalized) - Plan: PT plan of  care cert/re-cert  Muscle spasm of back - Plan: PT plan of care cert/re-cert  Cervicalgia - Plan: PT plan of care cert/re-cert  Abnormal posture - Plan: PT plan of care cert/re-cert     Problem List Patient Active Problem List   Diagnosis Date Noted  . 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 Tremor 09/26/2016  . Chronic constipation 02/03/2014  . Arthritis 02/03/2014  . Tobacco use 12/11/2012  . Migraine headache 01/10/2007   Lulu Riding PT, DPT, LAT, ATC  11/24/16  12:44 PM      [facility] [address] [place], [place], [address] Phone: [phone]   Fax:  [phone]  Name: ALLEYA DEMETER MRN: [id number] Date of Birth: Nov 08, 1971

## 2016-11-28 ENCOUNTER — Ambulatory Visit: Payer: Self-pay | Admitting: Neurology

## 2016-11-29 ENCOUNTER — Ambulatory Visit: Payer: Self-pay | Admitting: Physical Therapy

## 2016-12-01 ENCOUNTER — Ambulatory Visit: Payer: Self-pay | Admitting: Physical Therapy

## 2016-12-06 ENCOUNTER — Ambulatory Visit: Payer: Self-pay | Admitting: Physical Therapy

## 2016-12-06 DIAGNOSIS — M545 Low back pain: Principal | ICD-10-CM

## 2016-12-06 DIAGNOSIS — M62830 Muscle spasm of back: Secondary | ICD-10-CM

## 2016-12-06 DIAGNOSIS — M6281 Muscle weakness (generalized): Secondary | ICD-10-CM

## 2016-12-06 DIAGNOSIS — G8929 Other chronic pain: Secondary | ICD-10-CM

## 2016-12-06 DIAGNOSIS — M542 Cervicalgia: Secondary | ICD-10-CM

## 2016-12-06 DIAGNOSIS — R293 Abnormal posture: Secondary | ICD-10-CM

## 2016-12-06 NOTE — Therapy (Signed)
[facility] [address] [place], [place], [address] Phone: [phone]   Fax:  [phone]  Physical Therapy Treatment  Patient Details  Name: [name] MRN: [id number] Date of Birth: Nov 19, 1970 Referring Provider: Valeria Batman MD  Encounter Date: 12/06/2016      PT End of Session - 12/06/16 1303    Visit Number 2   Number of Visits 17   Date for PT Re-Evaluation 01/19/17   PT Start Time 0853  patient arrived 8 minutes late   PT Stop Time 0932   PT Time Calculation (min) 39 min   Activity Tolerance Patient tolerated treatment well   Behavior During Therapy [facility] for tasks assessed/performed      Past Medical History:  Diagnosis Date  . Anemia   . Anxiety   . Chest pain    a. Felt noncardiac with normal stress echo 11/2012.  Marland Kitchen Cholelithiasis    a. on Abd [place] 11/2012.  Marland Kitchen Complication of anesthesia   . Depression   . GERD (gastroesophageal reflux disease)   . Hypertension   . Migraines   . PONV (postoperative nausea and vomiting)   . Radiculopathy of cervical region     Past Surgical History:  Procedure Laterality Date  . CHOLECYSTECTOMY N/A 08/07/2013   Procedure: LAPAROSCOPIC CHOLECYSTECTOMY;  Surgeon: Robyne Askew, MD;  Location: [facility] OR;  Service: General;  Laterality: N/A;  . CLEFT LIP REPAIR     Multiple  . CLEFT PALATE REPAIR     Multiple  . ROTATOR CUFF REPAIR  2010   left  . TUBAL LIGATION  1998    There were no vitals filed for this visit.      Subjective Assessment - 12/06/16 0855    Subjective "I have been having trouble doing the exercises where I lie on my back. The pain is at an 8 right now, I could barely walk this morning it was so bad."   Currently in Pain? Yes   Pain Score 8    Pain Location Neck   Pain Orientation Right   Pain Descriptors / Indicators Tightness;Sharp;Burning   Pain Type Chronic pain   Pain Radiating Towards down R arm    Pain Onset More than a month ago   Pain Frequency Constant   Aggravating Factors  everything, laying down, sitting, stress   Pain Relieving Factors ice   Multiple Pain Sites Yes   Pain Score 8   Pain Location Back   Pain Orientation Right;Lower;Mid   Pain Descriptors / Indicators Burning;Sharp   Pain Type Chronic pain   Pain Radiating Towards in both legs, more at night, not radiating past knee   Pain Onset More than a month ago   Pain Frequency Constant   Aggravating Factors  getting in and out of bed, prolonged sitting, bending   Pain Relieving Factors N/A            [facility] PT Assessment - 12/06/16 0001      Special Tests   Cervical Tests Spurling's;Dictraction  ULTT 1 positive, rotation limited on L   Sacroiliac Tests  --  Forward flexion positive on L; Gillett's negative both sides     Spurling's   Findings Positive   Side Right     Distraction Test   Findngs Positive   side Right   Comment relief with distraction but immediately back into hand with release  [facility] Adult PT Treatment/Exercise - 12/06/16 0001      Exercises   Exercises Lumbar     Neck Exercises: Supine   Neck Retraction 10 reps;3 secs  being sure to bring straight back not flexing or extending     Lumbar Exercises: Stretches   Active Hamstring Stretch 2 reps;30 seconds  instructed to use a towel at home instead of stretchy band   Lower Trunk Rotation 4 reps  x 5 seconds; instructed not to go into pain just a stretch   Pelvic Tilt --  10 reps x 5 second hold; not tilting pelvis too far forward     Manual Therapy   Manual Therapy Soft tissue mobilization   Manual therapy comments DTM of R and L upper traps     Neck Exercises: Stretches   Upper Trapezius Stretch 30 seconds;1 rep  each side; holding onto table for increased stretch          Trigger Point Dry Needling - 12/06/16 0917    Consent Given? Yes   Education Handout Provided Yes   Muscles Treated Upper Body Upper trapezius    Upper Trapezius Response Twitch reponse elicited;Palpable increased muscle length  on R and L              PT Education - 12/06/16 1300    Education provided Yes   Education Details information of DN, evaluation findings of cervical and SIJ testing, reassessed and modified original HEP   Person(s) Educated Patient   Methods Explanation;Tactile cues;Verbal cues   Comprehension Verbalized understanding;Verbal cues required;Tactile cues required          PT Short Term Goals - 11/24/16 1207      PT SHORT TERM GOAL #1   Title pt will be I with inital HEP (12/15/2016)   Time 3   Period Weeks   Status New     PT SHORT TERM GOAL #2   Title pt will be able to verbalize and demo proper lifting/ carrying mechanics to prevent and reduce low back pain (12/15/2016   Time 3   Period Weeks   Status New     PT SHORT TERM GOAL #3   Title she will increase lumbar mobility by >/= 10 degrees for flexion/ extension and 5 degrees for bil sidebending with </= 4/10 pain (12/15/2016)   Time 3   Period Weeks   Status New           PT Long Term Goals - 11/24/16 1208      PT LONG TERM GOAL #1   Title pt will be I with all HEP given as of last visit (01/19/2017)   Time 6   Period Weeks   Status New     PT LONG TERM GOAL #2   Title pt will be demonstrate decreased low back muscle spasm to decrease pain to </= 2/10 and increase functional trunk mobility (01/19/2017)   Time 6   Period Weeks   Status New     PT LONG TERM GOAL #3   Title pt will increase bil hip strength to >/= 4/5 with </= 2/10 pain to assist with endurance (01/19/2017)   Time 6   Period Weeks   Status New     PT LONG TERM GOAL #4   Title pt will be able to sit/ stand and walk for >/= 30 minutes with </= 2/10 pain for functional enduracne required for ADLS and community ambulatory activities (01/19/2017)   Time 6  Period Weeks   Status New     PT LONG TERM GOAL #5   Title she will increase her FOTO score to </= 50% to  demonstrate improvement in function at discharge (01/19/2017)   Time 6   Period Weeks   Status New               Plan - 12/06/16 1308    Clinical Impression Statement Pt arrived 8 minutes late today. She reported a significant increase in neck pain over the last few days with some tingling down into her R thumb, index, and middle digits. Reported that everything seems to aggravate the pain including her HEP exercises performed while lying down. DN was performed by certified PT, afterwards pt reported decreased pain at a 5/10.  While reassessing HEP told pt to not go into pain when performing pelvic tilts and lower trunk rotation, as well as performing chin tucks in supine instead of seated. Pt declined MHP post session.  Based on cluster testing for cervical radiculopathy she demonstrates 3-4/4 positive test indicating high prevalence of cervical radiculopathy.    PT Next Visit Plan assess response to DN, assess review HEP, soft tissue work, bil LE strengthening, modalities PRN, SIJ mobs   PT Home Exercise Plan chin tucks, upper trap stretch, LTR, pelvic tilt, hamstring stretching   Consulted and Agree with Plan of Care Patient      Patient will benefit from skilled therapeutic intervention in order to improve the following deficits and impairments:  Pain, Postural dysfunction, Improper body mechanics, Decreased endurance, Decreased activity tolerance, Decreased range of motion, Decreased strength, Increased fascial restricitons, Hypomobility  Visit Diagnosis: Chronic right-sided low back pain, with sciatica presence unspecified  Muscle weakness (generalized)  Muscle spasm of back  Cervicalgia  Abnormal posture     Problem List Patient Active Problem List   Diagnosis Date Noted  . 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 Tremor 09/26/2016  . Chronic constipation 02/03/2014  . Arthritis 02/03/2014  . Tobacco use 12/11/2012  . Migraine headache 01/10/2007    Zada Girt, SPT 12/06/2016, 1:31 PM  [facility] [address] [place], [place], [address] Phone: [phone]   Fax:  [phone]  Name: [name] MRN: [id number] Date of Birth: Jan 02, 1971  Lulu Riding PT, DPT, LAT, ATC  12/06/16  1:34 PM

## 2016-12-08 ENCOUNTER — Ambulatory Visit: Payer: Self-pay | Admitting: Physical Therapy

## 2016-12-08 DIAGNOSIS — M6281 Muscle weakness (generalized): Secondary | ICD-10-CM

## 2016-12-08 DIAGNOSIS — M62830 Muscle spasm of back: Secondary | ICD-10-CM

## 2016-12-08 DIAGNOSIS — R293 Abnormal posture: Secondary | ICD-10-CM

## 2016-12-08 DIAGNOSIS — M542 Cervicalgia: Secondary | ICD-10-CM

## 2016-12-08 DIAGNOSIS — G8929 Other chronic pain: Secondary | ICD-10-CM

## 2016-12-08 DIAGNOSIS — M545 Low back pain: Principal | ICD-10-CM

## 2016-12-08 NOTE — Therapy (Signed)
[facility] [address] [place], [place], [address] Phone: [phone]   Fax:  [phone]  Physical Therapy Treatment  Patient Details  Name: [name] MRN: [id number] Date of Birth: 08-04-1971 Referring Provider: Valeria Batman MD  Encounter Date: 12/08/2016      PT End of Session - 12/08/16 1054    Visit Number 3   Number of Visits 17   Date for PT Re-Evaluation 01/19/17   PT Start Time 0849   PT Stop Time 0944   PT Time Calculation (min) 55 min   Activity Tolerance Patient tolerated treatment well   Behavior During Therapy [facility] for tasks assessed/performed      Past Medical History:  Diagnosis Date  . Anemia   . Anxiety   . Chest pain    a. Felt noncardiac with normal stress echo 11/2012.  Marland Kitchen Cholelithiasis    a. on Abd [place] 11/2012.  Marland Kitchen Complication of anesthesia   . Depression   . GERD (gastroesophageal reflux disease)   . Hypertension   . Migraines   . PONV (postoperative nausea and vomiting)   . Radiculopathy of cervical region     Past Surgical History:  Procedure Laterality Date  . CHOLECYSTECTOMY N/A 08/07/2013   Procedure: LAPAROSCOPIC CHOLECYSTECTOMY;  Surgeon: Robyne Askew, MD;  Location: [facility] OR;  Service: General;  Laterality: N/A;  . CLEFT LIP REPAIR     Multiple  . CLEFT PALATE REPAIR     Multiple  . ROTATOR CUFF REPAIR  2010   left  . TUBAL LIGATION  1998    There were no vitals filed for this visit.      Subjective Assessment - 12/08/16 0849    Subjective "My neck has been remarkably better since the DN at my last visit. My back pain is still there. The exercises are going better with not as much pain."   Currently in Pain? Yes   Pain Score 4    Pain Location Neck   Pain Orientation Right   Pain Descriptors / Indicators Sharp  not as intense as before   Pain Type Chronic pain   Pain Radiating Towards still having radiating symptoms to 3rd digit   Pain Frequency Intermittent    Multiple Pain Sites Yes   Pain Score 8   Pain Location Back   Pain Orientation Lower;Mid;Left   Pain Descriptors / Indicators Sharp;Burning   Pain Type Chronic pain   Pain Radiating Towards in both legs especially at night   Pain Onset More than a month ago   Pain Frequency Constant   Aggravating Factors  getting out of bed, prolonged sitting   Pain Relieving Factors N/A                         [facility] Adult PT Treatment/Exercise - 12/08/16 0001      Lumbar Exercises: Stretches   Active Hamstring Stretch 3 reps;30 seconds  both sides L more limited, contract/relax with 10s hold   Lower Trunk Rotation 5 reps  5 second hold each side     Knee/Hip Exercises: Aerobic   Nustep L4 x 7 minutes     Knee/Hip Exercises: Supine   Hip Adduction Isometric 2 sets;10 reps  1 x with ball between knees, 2nd with manual resistance     Modalities   Modalities Electrical Stimulation;Moist Heat     Moist Heat Therapy   Number Minutes Moist Heat 10 Minutes  Moist Heat Location Lumbar Spine     Electrical Stimulation   Electrical Stimulation Location L lumbar   Electrical Stimulation Action IFC   Electrical Stimulation Parameters L 30, 10 minutes, scan 100%,   with heat     Manual Therapy   Manual Therapy Soft tissue mobilization;Myofascial release;Joint mobilization   Joint Mobilization L anterior and posterior innominate mob  nearly helped with pain   Soft tissue mobilization IASTM over L glute med   Myofascial Release manual trigger point release at L glute med                  PT Short Term Goals - 11/24/16 1207      PT SHORT TERM GOAL #1   Title pt will be I with inital HEP (12/15/2016)   Time 3   Period Weeks   Status New     PT SHORT TERM GOAL #2   Title pt will be able to verbalize and demo proper lifting/ carrying mechanics to prevent and reduce low back pain (12/15/2016   Time 3   Period Weeks   Status New     PT SHORT TERM GOAL #3   Title  she will increase lumbar mobility by >/= 10 degrees for flexion/ extension and 5 degrees for bil sidebending with </= 4/10 pain (12/15/2016)   Time 3   Period Weeks   Status New           PT Long Term Goals - 11/24/16 1208      PT LONG TERM GOAL #1   Title pt will be I with all HEP given as of last visit (01/19/2017)   Time 6   Period Weeks   Status New     PT LONG TERM GOAL #2   Title pt will be demonstrate decreased low back muscle spasm to decrease pain to </= 2/10 and increase functional trunk mobility (01/19/2017)   Time 6   Period Weeks   Status New     PT LONG TERM GOAL #3   Title pt will increase bil hip strength to >/= 4/5 with </= 2/10 pain to assist with endurance (01/19/2017)   Time 6   Period Weeks   Status New     PT LONG TERM GOAL #4   Title pt will be able to sit/ stand and walk for >/= 30 minutes with </= 2/10 pain for functional enduracne required for ADLS and community ambulatory activities (01/19/2017)   Time 6   Period Weeks   Status New     PT LONG TERM GOAL #5   Title she will increase her FOTO score to </= 50% to demonstrate improvement in function at discharge (01/19/2017)   Time 6   Period Weeks   Status New               Plan - 12/08/16 1055    Clinical Impression Statement Pt reported significant relief in neck pain following dry needling and would like to try DN for her back in the future. Reported her HEP is easier to do since modification at last session. L innominate mobs were performed with no relief. Multiple trigger points were found in L glute medius. Following adduction exercises she reported that getting up was much easier. E-stim and MHP were used post session.    PT Next Visit Plan assess response to DN, assess review HEP, soft tissue work, bil LE strengthening, modalities PRN, SIJ mobs   PT Home Exercise Plan chin tucks,  upper trap stretch, LTR, pelvic tilt, hamstring stretching   Consulted and Agree with Plan of Care Patient       Patient will benefit from skilled therapeutic intervention in order to improve the following deficits and impairments:  Pain, Postural dysfunction, Improper body mechanics, Decreased endurance, Decreased activity tolerance, Decreased range of motion, Decreased strength, Increased fascial restricitons, Hypomobility  Visit Diagnosis: Chronic right-sided low back pain, with sciatica presence unspecified  Muscle weakness (generalized)  Muscle spasm of back  Cervicalgia  Abnormal posture     Problem List Patient Active Problem List   Diagnosis Date Noted  . 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 Tremor 09/26/2016  . Chronic constipation 02/03/2014  . Arthritis 02/03/2014  . Tobacco use 12/11/2012  . Migraine headache 01/10/2007    Zada GirtHaley Sariyah Corcino, SPT 12/08/2016, 11:01 AM  [facility] [address] [place], [place], [address] Phone: [phone]   Fax:  [phone]  Name: [name] MRN: [id number] Date of Birth: 10/09/1971  Lulu RidingKristoffer Leamon PT, DPT, LAT, ATC  12/08/16  12:05 PM

## 2016-12-11 ENCOUNTER — Ambulatory Visit: Payer: Self-pay | Admitting: Physical Therapy

## 2016-12-11 DIAGNOSIS — M545 Low back pain: Principal | ICD-10-CM

## 2016-12-11 DIAGNOSIS — M542 Cervicalgia: Secondary | ICD-10-CM

## 2016-12-11 DIAGNOSIS — G8929 Other chronic pain: Secondary | ICD-10-CM

## 2016-12-11 DIAGNOSIS — M6281 Muscle weakness (generalized): Secondary | ICD-10-CM

## 2016-12-11 DIAGNOSIS — R293 Abnormal posture: Secondary | ICD-10-CM

## 2016-12-11 DIAGNOSIS — M62830 Muscle spasm of back: Secondary | ICD-10-CM

## 2016-12-11 NOTE — Therapy (Signed)
[facility] [address] [place], [place], [address] Phone: [phone]   Fax:  [phone]  Physical Therapy Treatment  Patient Details  Name: [name] MRN: [id number] Date of Birth: [birth year] Referring Provider: Valeria Batman MD  Encounter Date: 12/11/2016      PT End of Session - 12/11/16 0837    Visit Number 4   Number of Visits 17   Date for PT Re-Evaluation 01/19/17   PT Start Time 0836   PT Stop Time 0929   PT Time Calculation (min) 53 min      Past Medical History:  Diagnosis Date  . Anemia   . Anxiety   . Chest pain    a. Felt noncardiac with normal stress echo 11/2012.  Marland Kitchen Cholelithiasis    a. on Abd [place] 11/2012.  Marland Kitchen Complication of anesthesia   . Depression   . GERD (gastroesophageal reflux disease)   . Hypertension   . Migraines   . PONV (postoperative nausea and vomiting)   . Radiculopathy of cervical region     Past Surgical History:  Procedure Laterality Date  . CHOLECYSTECTOMY N/A 08/07/2013   Procedure: LAPAROSCOPIC CHOLECYSTECTOMY;  Surgeon: Robyne Askew, MD;  Location: [facility] OR;  Service: General;  Laterality: N/A;  . CLEFT LIP REPAIR     Multiple  . CLEFT PALATE REPAIR     Multiple  . ROTATOR CUFF REPAIR  2010   left  . TUBAL LIGATION  1998    There were no vitals filed for this visit.      Subjective Assessment - 12/11/16 0838    Subjective I am  not doing to good.     Currently in Pain? Yes   Pain Score 6    Pain Location Neck   Pain Orientation Right;Mid   Pain Descriptors / Indicators --  feels broken   Aggravating Factors  laying down, sitting, stress   Pain Relieving Factors ice, supported sitting    Pain Score 9   Pain Location Back   Pain Orientation Lower;Mid   Pain Descriptors / Indicators Sharp;Burning   Aggravating Factors  worse in the mornings,   Pain Relieving Factors stretches sometimes                         [facility] Adult PT  Treatment/Exercise - 12/11/16 0001      Self-Care   Self-Care Posture;Other Self-Care Comments   Posture Supported sitting with postural awareness   Other Self-Care Comments  Self trigger point release with tennis ball to left gluteals      Neck Exercises: Seated   Neck Retraction 10 reps   W Back 10 reps   Postural Training attempting good posture throughout treatment    Other Seated Exercise scap retraction 5 sec x 15     Lumbar Exercises: Seated   Sit to Stand Limitations seated ball squeeze x 10 , clam yellow x 10, attempted marches too painful      Lumbar Exercises: Supine   Other Supine Lumbar Exercises Decompression position attempted with knee on physioball and with knees in chair- pt reports decrease in right low back pain however increase in pain on left side      Manual Therapy   Soft tissue mobilization Massage roller used over left lumbar, left gluteals  in right sidelying                   PT Short  Term Goals - 11/24/16 1207      PT SHORT TERM GOAL #1   Title pt will be I with inital HEP (12/15/2016)   Time 3   Period Weeks   Status New     PT SHORT TERM GOAL #2   Title pt will be able to verbalize and demo proper lifting/ carrying mechanics to prevent and reduce low back pain (12/15/2016   Time 3   Period Weeks   Status New     PT SHORT TERM GOAL #3   Title she will increase lumbar mobility by >/= 10 degrees for flexion/ extension and 5 degrees for bil sidebending with </= 4/10 pain (12/15/2016)   Time 3   Period Weeks   Status New           PT Long Term Goals - 11/24/16 1208      PT LONG TERM GOAL #1   Title pt will be I with all HEP given as of last visit (01/19/2017)   Time 6   Period Weeks   Status New     PT LONG TERM GOAL #2   Title pt will be demonstrate decreased low back muscle spasm to decrease pain to </= 2/10 and increase functional trunk mobility (01/19/2017)   Time 6   Period Weeks   Status New     PT LONG TERM GOAL #3    Title pt will increase bil hip strength to >/= 4/5 with </= 2/10 pain to assist with endurance (01/19/2017)   Time 6   Period Weeks   Status New     PT LONG TERM GOAL #4   Title pt will be able to sit/ stand and walk for >/= 30 minutes with </= 2/10 pain for functional enduracne required for ADLS and community ambulatory activities (01/19/2017)   Time 6   Period Weeks   Status New     PT LONG TERM GOAL #5   Title she will increase her FOTO score to </= 50% to demonstrate improvement in function at discharge (01/19/2017)   Time 6   Period Weeks   Status New               Plan - 12/11/16 0934    Clinical Impression Statement Pt reports everything last visit was helpful except the Estim. She feels that it flared up her pain. Her pain is 9/10 in lumbar and 6/10 in neck. She was unable to find comfortable positon in hooklying today. Trial of decompression position with feet on chair increased her lumbar pain on left side. She was able to tolerate supported sitting for neck, hip and core exercises. She was able to tolerate sidelying for foam roller. Pt instructed in self massage with tennis all in sidelying and seated. She reports tennis ball pressure relieves pressure in hip and increased pain in lumbar. After treatment lumbar 7/10 and neck 5/10. She declined modalities.    PT Next Visit Plan assess response to DN, assess review HEP, soft tissue work, bil LE strengthening, modalities PRN, SIJ mobs   PT Home Exercise Plan chin tucks, upper trap stretch, LTR, pelvic tilt, hamstring stretching, tennis ball for self massage   Consulted and Agree with Plan of Care Patient      Patient will benefit from skilled therapeutic intervention in order to improve the following deficits and impairments:  Pain, Postural dysfunction, Improper body mechanics, Decreased endurance, Decreased activity tolerance, Decreased range of motion, Decreased strength, Increased fascial restricitons, Hypomobility  Visit  Diagnosis: Chronic right-sided low back pain, with sciatica presence unspecified  Muscle weakness (generalized)  Muscle spasm of back  Cervicalgia  Abnormal posture     Problem List Patient Active Problem List   Diagnosis Date Noted  . 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 Tremor 09/26/2016  . Chronic constipation 02/03/2014  . Arthritis 02/03/2014  . Tobacco use 12/11/2012  . Migraine headache 01/10/2007    Sherrie Mustacheonoho, [name], PTA 12/11/2016, 9:49 AM  [facility] [address] [place], [place], [address] Phone: [phone]   Fax:  [phone]  Name: [name] MRN: [id number] Date of Birth: 06/29/1971

## 2016-12-13 ENCOUNTER — Ambulatory Visit: Payer: Self-pay | Admitting: Physical Therapy

## 2016-12-13 DIAGNOSIS — M542 Cervicalgia: Secondary | ICD-10-CM

## 2016-12-13 DIAGNOSIS — R293 Abnormal posture: Secondary | ICD-10-CM

## 2016-12-13 DIAGNOSIS — M545 Low back pain: Principal | ICD-10-CM

## 2016-12-13 DIAGNOSIS — M62830 Muscle spasm of back: Secondary | ICD-10-CM

## 2016-12-13 DIAGNOSIS — M6281 Muscle weakness (generalized): Secondary | ICD-10-CM

## 2016-12-13 DIAGNOSIS — G8929 Other chronic pain: Secondary | ICD-10-CM

## 2016-12-13 NOTE — Therapy (Signed)
[facility] [address] [place], [place], [address] Phone: [phone]   Fax:  [phone]  Physical Therapy Treatment  Patient Details  Name: [name] MRN: [id number] Date of Birth: [birth year] Referring Provider: Valeria Batman MD  Encounter Date: 12/13/2016      PT End of Session - 12/13/16 1132    Visit Number 5   Number of Visits 17   Date for PT Re-Evaluation 01/19/17   PT Start Time 0934   PT Stop Time 1017   PT Time Calculation (min) 43 min   Activity Tolerance Patient tolerated treatment well   Behavior During Therapy [facility] for tasks assessed/performed      Past Medical History:  Diagnosis Date  . Anemia   . Anxiety   . Chest pain    a. Felt noncardiac with normal stress echo 11/2012.  Marland Kitchen Cholelithiasis    a. on Abd [place] 11/2012.  Marland Kitchen Complication of anesthesia   . Depression   . GERD (gastroesophageal reflux disease)   . Hypertension   . Migraines   . PONV (postoperative nausea and vomiting)   . Radiculopathy of cervical region     Past Surgical History:  Procedure Laterality Date  . CHOLECYSTECTOMY N/A 08/07/2013   Procedure: LAPAROSCOPIC CHOLECYSTECTOMY;  Surgeon: Robyne Askew, MD;  Location: [facility] OR;  Service: General;  Laterality: N/A;  . CLEFT LIP REPAIR     Multiple  . CLEFT PALATE REPAIR     Multiple  . ROTATOR CUFF REPAIR  2010   left  . TUBAL LIGATION  1998    There were no vitals filed for this visit.      Subjective Assessment - 12/13/16 0933    Subjective "I am okay, but my back is still hurting a lot."   Currently in Pain? Yes   Pain Score 4    Pain Location Neck   Pain Orientation Right;Mid   Pain Score 8   Pain Location Back   Pain Orientation Mid;Lower   Pain Descriptors / Indicators Sharp                         [facility] Adult PT Treatment/Exercise - 12/13/16 0001      Lumbar Exercises: Seated   Sit to Stand Limitations seated ball squeeze x 10  with  5 s hold     Knee/Hip Exercises: Aerobic   Nustep L4 x 5 min     Manual Therapy   Joint Mobilization grade 2-3 PA PAIVM L1-L5   Soft tissue mobilization IASTM over R and L lumbar paraspinals and R and L glute med          Trigger Point Dry Needling - 12/13/16 6962    Consent Given? Yes   Education Handout Provided Yes   Muscles Treated Upper Body Longissimus   Longissimus Response Twitch response elicited;Palpable increased muscle length  R and L lumbar paraspinals at L1-L3                PT Short Term Goals - 11/24/16 1207      PT SHORT TERM GOAL #1   Title pt will be I with inital HEP (12/15/2016)   Time 3   Period Weeks   Status New     PT SHORT TERM GOAL #2   Title pt will be able to verbalize and demo proper lifting/ carrying mechanics to prevent and reduce low back pain (12/15/2016   Time  3   Period Weeks   Status New     PT SHORT TERM GOAL #3   Title she will increase lumbar mobility by >/= 10 degrees for flexion/ extension and 5 degrees for bil sidebending with </= 4/10 pain (12/15/2016)   Time 3   Period Weeks   Status New           PT Long Term Goals - 11/24/16 1208      PT LONG TERM GOAL #1   Title pt will be I with all HEP given as of last visit (01/19/2017)   Time 6   Period Weeks   Status New     PT LONG TERM GOAL #2   Title pt will be demonstrate decreased low back muscle spasm to decrease pain to </= 2/10 and increase functional trunk mobility (01/19/2017)   Time 6   Period Weeks   Status New     PT LONG TERM GOAL #3   Title pt will increase bil hip strength to >/= 4/5 with </= 2/10 pain to assist with endurance (01/19/2017)   Time 6   Period Weeks   Status New     PT LONG TERM GOAL #4   Title pt will be able to sit/ stand and walk for >/= 30 minutes with </= 2/10 pain for functional enduracne required for ADLS and community ambulatory activities (01/19/2017)   Time 6   Period Weeks   Status New     PT LONG TERM GOAL #5   Title she  will increase her FOTO score to </= 50% to demonstrate improvement in function at discharge (01/19/2017)   Time 6   Period Weeks   Status New               Plan - 12/13/16 1137    Clinical Impression Statement [name] reports that her neck pain has decreased to a 4/10, but that her low back pain is at an 8/10. Dry Needling was performed by a licensed physical therapist. She was still sore immediately following DN, but after nustep and ball squeezes said back pain was down to a 5/10 on the left and 2/10 on the right.    PT Next Visit Plan assess response to DN, assess review HEP, soft tissue work, bil LE strengthening, modalities PRN, SIJ mobs   PT Home Exercise Plan chin tucks, upper trap stretch, LTR, pelvic tilt, hamstring stretching, tennis ball for self massage   Consulted and Agree with Plan of Care Patient      Patient will benefit from skilled therapeutic intervention in order to improve the following deficits and impairments:  Pain, Postural dysfunction, Improper body mechanics, Decreased endurance, Decreased activity tolerance, Decreased range of motion, Decreased strength, Increased fascial restricitons, Hypomobility  Visit Diagnosis: Chronic right-sided low back pain, with sciatica presence unspecified  Muscle weakness (generalized)  Muscle spasm of back  Cervicalgia  Abnormal posture     Problem List Patient Active Problem List   Diagnosis Date Noted  . 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 Tremor 09/26/2016  . Chronic constipation 02/03/2014  . Arthritis 02/03/2014  . Tobacco use 12/11/2012  . Migraine headache 01/10/2007    Zada GirtHaley Chloris Marcoux, SPT 12/13/2016, 11:45 AM  [facility] [address] [place], [place], [address] Phone: [phone]   Fax:  [phone]  Name: [name] MRN: [id number] Date of Birth: 09/08/1971

## 2016-12-15 ENCOUNTER — Ambulatory Visit: Payer: Self-pay | Attending: Orthopaedic Surgery | Admitting: Physical Therapy

## 2016-12-15 DIAGNOSIS — M542 Cervicalgia: Secondary | ICD-10-CM | POA: Insufficient documentation

## 2016-12-15 DIAGNOSIS — R293 Abnormal posture: Secondary | ICD-10-CM | POA: Insufficient documentation

## 2016-12-15 DIAGNOSIS — G8929 Other chronic pain: Secondary | ICD-10-CM | POA: Insufficient documentation

## 2016-12-15 DIAGNOSIS — M62830 Muscle spasm of back: Secondary | ICD-10-CM | POA: Insufficient documentation

## 2016-12-15 DIAGNOSIS — M6281 Muscle weakness (generalized): Secondary | ICD-10-CM | POA: Insufficient documentation

## 2016-12-15 DIAGNOSIS — M545 Low back pain: Secondary | ICD-10-CM | POA: Insufficient documentation

## 2016-12-15 NOTE — Therapy (Addendum)
[place], [place], [address] Phone: [phone]   Fax:  [phone]  Physical Therapy Treatment / Discharge note  Patient Details  Name: AZARIYA FREEMAN MRN: [id number] Date of Birth: [birth year] Referring Provider: Garald Balding MD  Encounter Date: 12/15/2016   NO treatment today, due to symptoms.     Past Medical History:  Diagnosis Date  . Anemia   . Anxiety   . Chest pain    a. Felt noncardiac with normal stress echo 11/2012.  Marland Kitchen Cholelithiasis    a. on Abd [place] 11/2012.  Marland Kitchen Complication of anesthesia   . Depression   . GERD (gastroesophageal reflux disease)   . Hypertension   . Migraines   . PONV (postoperative nausea and vomiting)   . Radiculopathy of cervical region     Past Surgical History:  Procedure Laterality Date  . CHOLECYSTECTOMY N/A 08/07/2013   Procedure: LAPAROSCOPIC CHOLECYSTECTOMY;  Surgeon: Merrie Roof, MD;  Location: [facility];  Service: General;  Laterality: N/A;  . CLEFT LIP REPAIR     Multiple  . CLEFT PALATE REPAIR     Multiple  . ROTATOR CUFF REPAIR  2010   left  . TUBAL LIGATION  1998    There were no vitals filed for this visit.      Subjective Assessment - 12/15/16 0957    Subjective I am having some new problems. I now have pain wrapping around front of hips and in between thighs. Also, the tops of both of my feet are painful. I am not numb. My back has been hurting more. I have been unable to walk on the trail as I could before. My incontinence seems to be getting worse.                                    PT Short Term Goals - 11/24/16 1207      PT SHORT TERM GOAL #1   Title pt will be I with inital HEP (12/15/2016)   Time 3   Period Weeks   Status New     PT SHORT TERM GOAL #2   Title pt will be able to verbalize and demo proper lifting/ carrying mechanics to prevent and reduce low back pain (12/15/2016   Time 3   Period Weeks    Status New     PT SHORT TERM GOAL #3   Title she will increase lumbar mobility by >/= 10 degrees for flexion/ extension and 5 degrees for bil sidebending with </= 4/10 pain (12/15/2016)   Time 3   Period Weeks   Status New           PT Long Term Goals - 11/24/16 1208      PT LONG TERM GOAL #1   Title pt will be I with all HEP given as of last visit (01/19/2017)   Time 6   Period Weeks   Status New     PT LONG TERM GOAL #2   Title pt will be demonstrate decreased low back muscle spasm to decrease pain to </= 2/10 and increase functional trunk mobility (01/19/2017)   Time 6   Period Weeks   Status New     PT LONG TERM GOAL #3   Title pt will increase bil hip strength to >/= 4/5 with </= 2/10 pain to assist with endurance (01/19/2017)   Time  6   Period Weeks   Status New     PT LONG TERM GOAL #4   Title pt will be able to sit/ stand and walk for >/= 30 minutes with </= 2/10 pain for functional enduracne required for ADLS and community ambulatory activities (01/19/2017)   Time 6   Period Weeks   Status New     PT LONG TERM GOAL #5   Title she will increase her FOTO score to </= 50% to demonstrate improvement in function at discharge (01/19/2017)   Time 6   Period Weeks   Status New               Plan - 12/15/16 7543    Clinical Impression Statement Due to patient reports of new saddle area symptoms and continued incontinence pt was recommended to go to ED for evaluation.       Patient will benefit from skilled therapeutic intervention in order to improve the following deficits and impairments:     Visit Diagnosis: Chronic right-sided low back pain, with sciatica presence unspecified  Cervicalgia  Abnormal posture  Muscle spasm of back  Muscle weakness (generalized)     Problem List Patient Active Problem List   Diagnosis Date Noted  . 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 Tremor 09/26/2016  . Chronic constipation 02/03/2014  . Arthritis  02/03/2014  . Tobacco use 12/11/2012  . Migraine headache 01/10/2007    Dorene Ar, PTA 12/15/2016, 10:01 AM  [facility] [address] [place], [place], [address] Phone: [phone]   Fax:  [phone]  Name: WINNIFRED DUFFORD MRN: [id number] Date of Birth: 13-Nov-1971        PHYSICAL THERAPY DISCHARGE SUMMARY  Visits from Start of Care: 5  Current functional level related to goals / functional outcomes: See goals   Remaining deficits: Limited trunk mobility, Continued low back pain that appears to be worsening, she cancelled all her appointments due to having more pain and plans to return to her MD.    Education / Equipment: HEP, posture education, theraband.  Plan: Patient agrees to discharge.  Patient goals were partially met. Patient is being discharged due to the patient's request.  ?????     Kristoffer Leamon PT, DPT, LAT, ATC  12/26/16  11:35 AM

## 2016-12-20 ENCOUNTER — Ambulatory Visit: Payer: Self-pay | Admitting: Physical Therapy

## 2016-12-22 ENCOUNTER — Ambulatory Visit: Payer: Self-pay | Admitting: Physical Therapy

## 2016-12-27 ENCOUNTER — Ambulatory Visit: Payer: Self-pay | Admitting: Physical Therapy

## 2016-12-29 ENCOUNTER — Ambulatory Visit: Payer: Self-pay | Admitting: Physical Therapy

## 2017-01-01 ENCOUNTER — Telehealth: Payer: Self-pay | Admitting: Neurology

## 2017-01-01 NOTE — Telephone Encounter (Signed)
Please ask her to take OTC extra strength tylenol or Naproxen for headache abortion. It is also important for her to relax and de-stress herself which are still the most effective way for the HA. Thanks.   Marvel PlanJindong Nikita Surman, MD PhD Stroke Neurology 01/01/2017 6:21 PM

## 2017-01-01 NOTE — Telephone Encounter (Signed)
Message sent to Dr.Xu. 

## 2017-01-01 NOTE — Telephone Encounter (Signed)
Pt said she's had a migraine for the past 5 days. Says she has tried sleep, ice,ibuprofen, goodies, Excedrin migraine but nothing has helped. [facility] Please call

## 2017-01-02 NOTE — Telephone Encounter (Signed)
Rn call patient back about her headache.Rn stated per Dr. Roda ShuttersXu she needs to take extra strength tylenol OTC or naproxen. Also to destress and relaxation techniques. Pt verbalized understanding.

## 2017-01-03 ENCOUNTER — Ambulatory Visit: Payer: Self-pay | Admitting: Physical Therapy

## 2017-01-05 ENCOUNTER — Ambulatory Visit: Payer: Self-pay | Admitting: Physical Therapy

## 2017-01-29 ENCOUNTER — Ambulatory Visit: Payer: Self-pay | Admitting: Neurology

## 2017-07-23 IMAGING — MR MR CERVICAL SPINE W/O CM
5 series · 29 of 48 positions shown · non-contrast
Comparison: None.

CLINICAL DATA: Neck pain and tremors.  Numbness in legs and arms.

EXAM:
MRI CERVICAL SPINE WITHOUT CONTRAST
TECHNIQUE: Multiplanar, multisequence MR imaging of the cervical spine was
performed. No intravenous contrast was administered.

[Series 2: T2 · sagittal · 3.3mm · 0.41mm/px · 6 of 12 slices shown (1 of 2)]
[im 1/12]
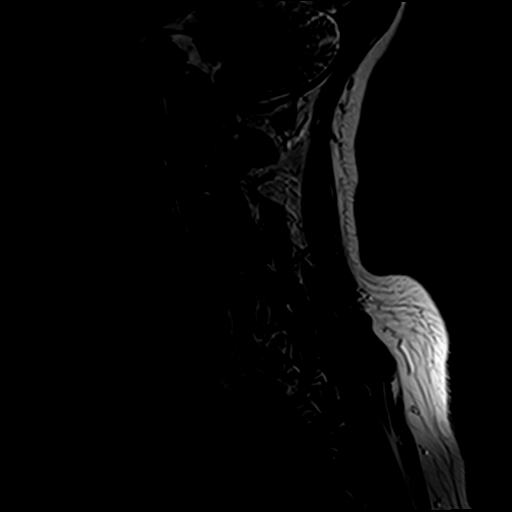
[im 3/12]
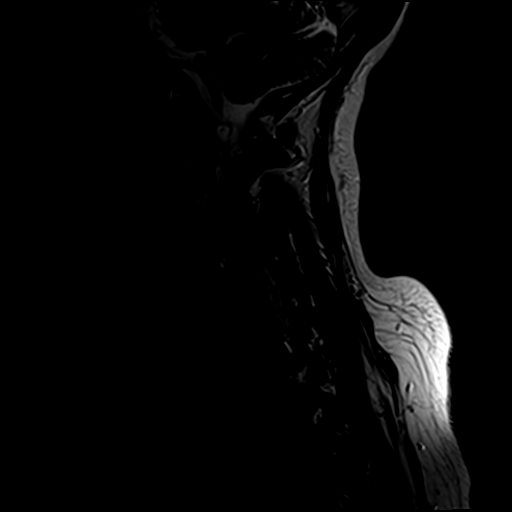
[im 5/12]
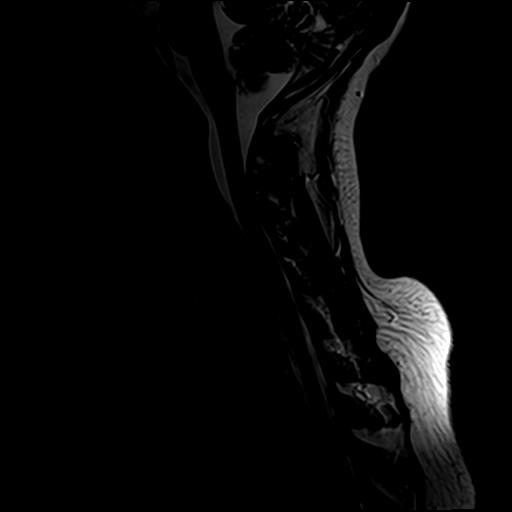
[im 7/12]
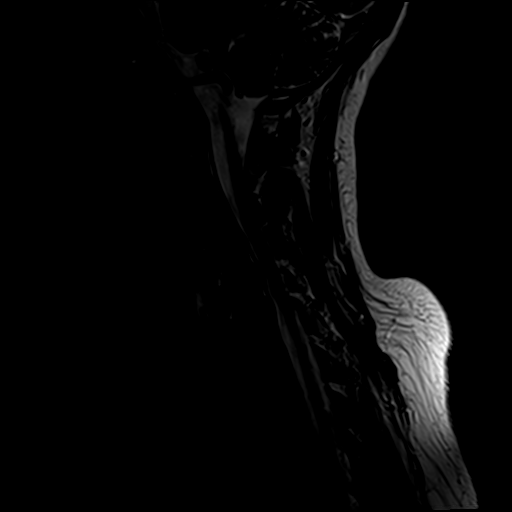
[im 9/12]
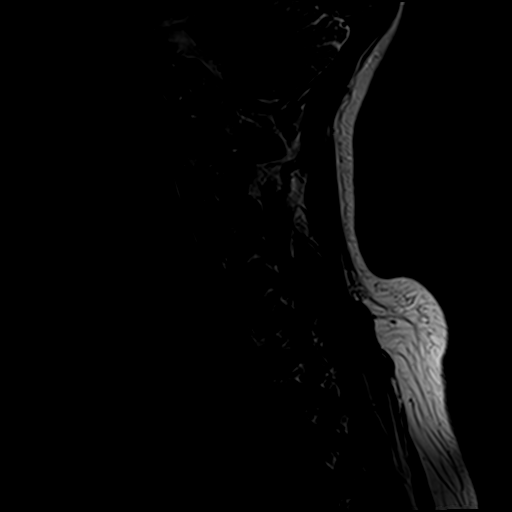
[im 12/12]
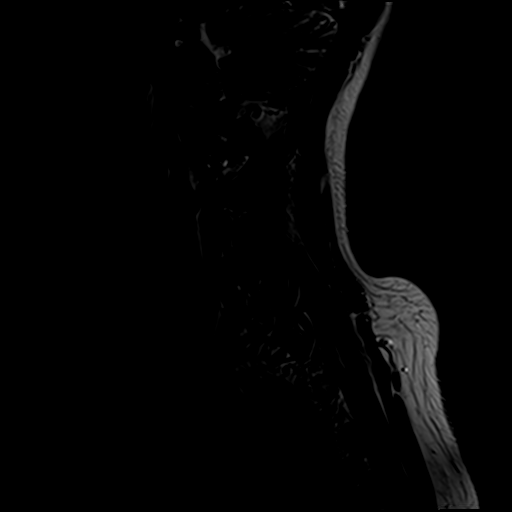

[Series 3: T1 · sagittal · 3.3mm · 0.41mm/px · 7 of 12 slices shown]
[im 1/12]
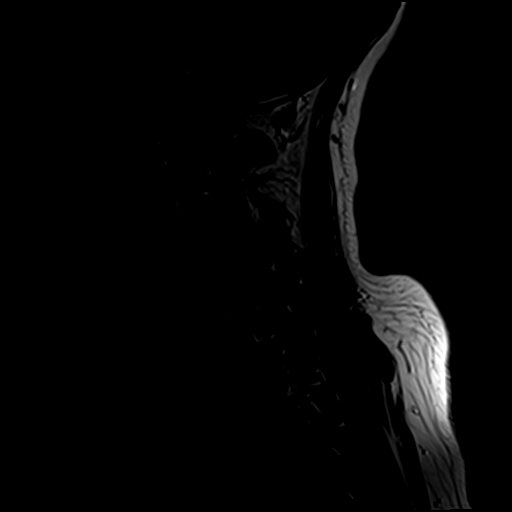
[im 2/12]
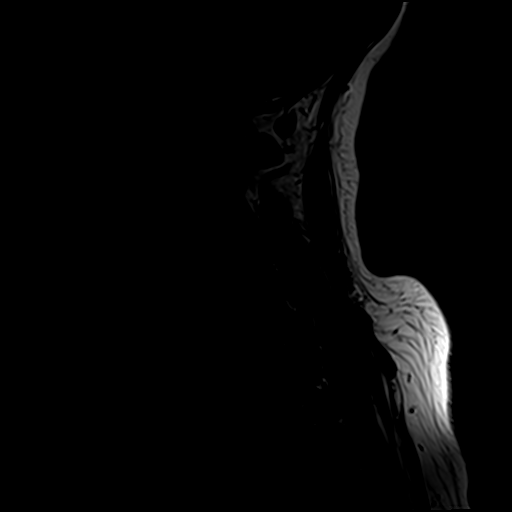
[im 4/12]
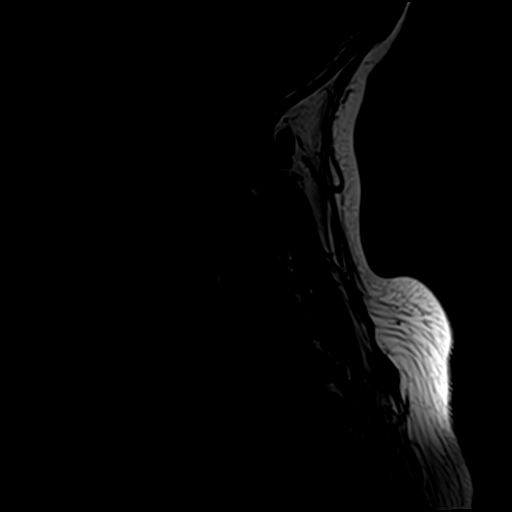
[im 6/12]
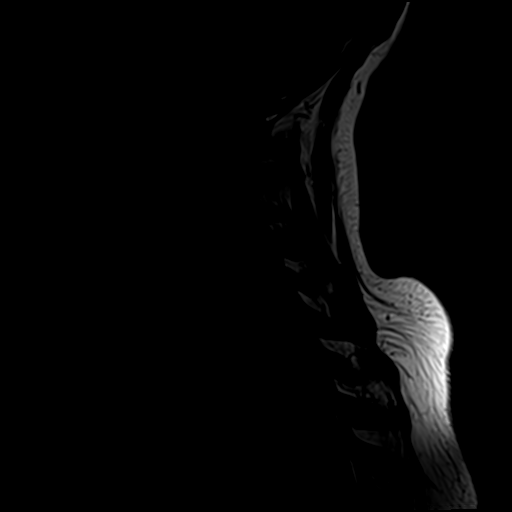
[im 8/12]
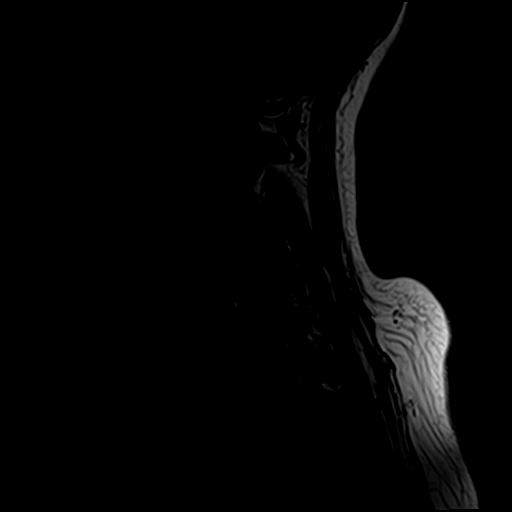
[im 10/12]
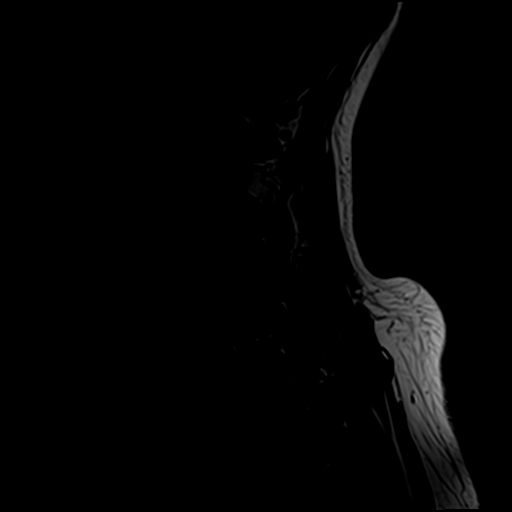
[im 12/12]
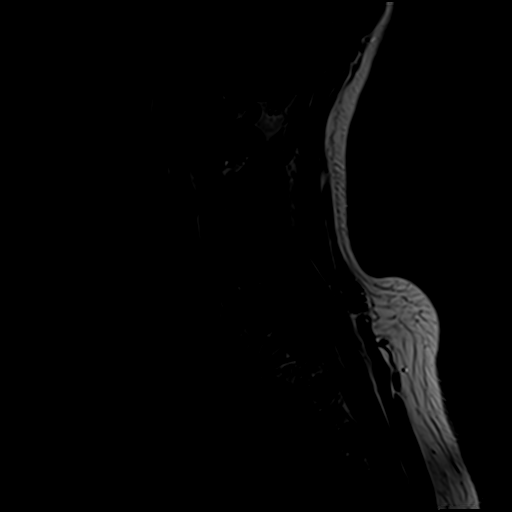

[Series 4: STIR · sagittal · 3.3mm · 0.82mm/px · 7 of 12 slices shown]
[im 1/12]
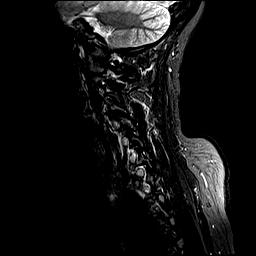
[im 2/12]
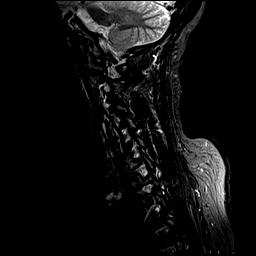
[im 4/12]
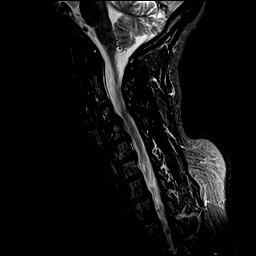
[im 6/12]
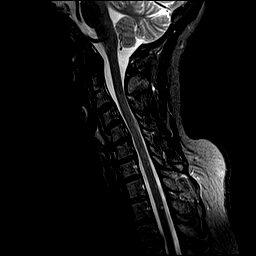
[im 8/12]
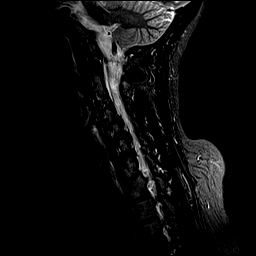
[im 10/12]
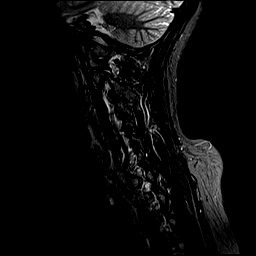
[im 12/12]
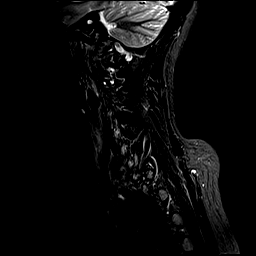

[Series 5: GRE · axial · 3.0mm · 0.35mm/px · 1 of 24 slices shown]
[im 1/24]
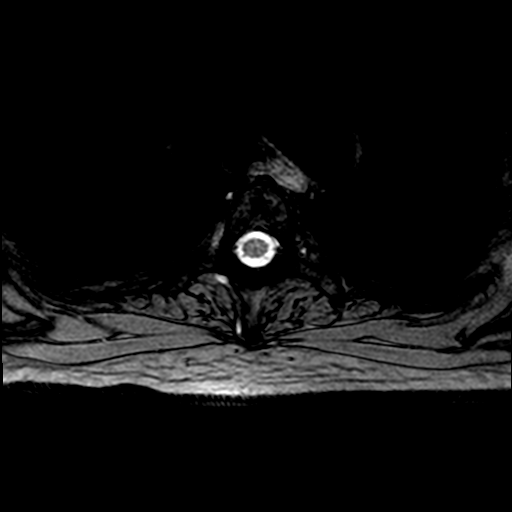

[Series 6: T2 · axial · 3.0mm · 0.70mm/px · z∈[-59,+27]mm · 8 of 24 slices shown (2 of 2)]
[im 1/24]
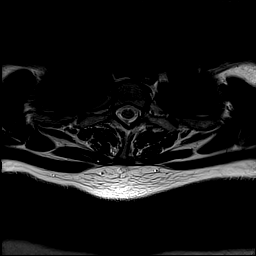
[im 4/24]
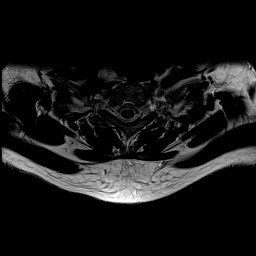
[im 8/24]
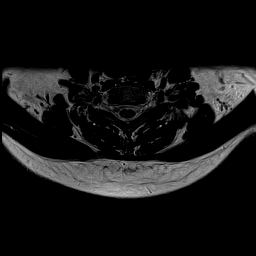
[im 11/24]
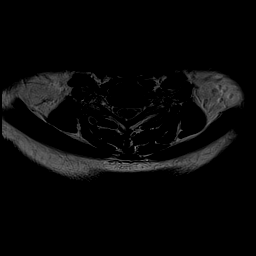
[im 13/24]
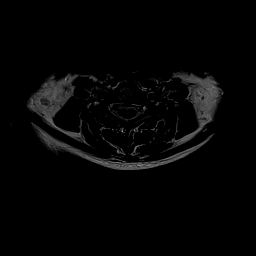
[im 16/24]
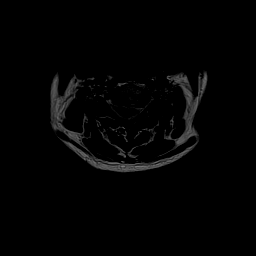
[im 20/24]
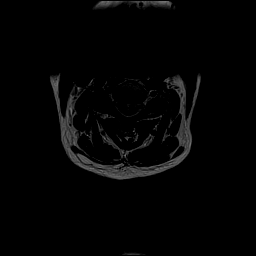
[im 24/24]
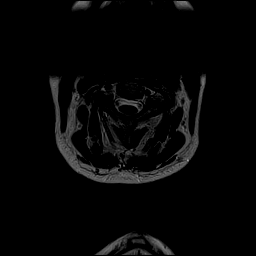

[29 of 48 positions shown; findings below may reference images not displayed]

FINDINGS: Alignment: Slight anterolisthesis at C4-5.

Vertebrae: C2-3 non segmentation

Cord: Normal signal and morphology.

Posterior Fossa, vertebral arteries, paraspinal tissues: Negative

Disc levels:

C2-3: Non segmentation.  No degenerative changes or impingement

C3-4: Facet arthropathy with bulky spurring and possible left facet
ankylosis. Mild disc narrowing. No impingement

C4-5: Facet arthropathy with bulky spurring on the right. Mild disc
narrowing and ridging. Patent canal and foramina

C5-6: Disc narrowing and endplate ridging with left more than right
uncovertebral spurring. Mild spinal stenosis with partial effacement
of ventral subarachnoid space. Moderate left foraminal stenosis.

C6-7: Disc narrowing with endplate and left more than right
uncovertebral ridging. Negative facets. Borderline spinal stenosis.
Patent foramina

C7-T1:Unremarkable.
IMPRESSION: 1. No evidence of myelopathy.
2. Upper cervical facet arthropathy and mid cervical disc
degeneration as described.
3. Early spinal stenosis is C5-6 and C6-7 without cord deformation.
C5-6 moderate left foraminal narrowing from uncovertebral spur.
4. C2-3 non segmentation.

## 2018-09-16 NOTE — Progress Notes (Signed)
Subjective:    Patient ID: [name], female    DOB: [birth year], 47 y.o.   MRN: [id number]  HPI:  Ms. Situ is here to establish as a new pt.  She is a pleasant 47 year old female. 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 Roda Shutters - migraine, tremor, movement d/o, last OV 10/23/16 Plan- - relaxation and de-stress are the key to improve - refer to psychologist for counseling - follow up with PCP regularly - follow up in 3 months  However per pt, "he refused to treat my migraines" and she never followed up with [facility]. She has been followed by [facility] being trated for chronic pain: DDD of cervical neck and lumbar back and fibromyalgia  Review of [place] Controlled Substance Database - Regular Refills of Alprazolam, Oxycodone, Hydrocodone, and Tramadol Discussed that these medications will need to be monitored/managed by a Pain Clinic, pt agreeable to referral She reports smoking 1/2 pack per month and seldom drinks ETOH She reports being "on every type of antidepressant" but is not interested in re-starting to manage depression/anxiety She also declined Behavioral Health Referral She reports exercising 45 mins daily- yoga, jumping jacks, sits ups, push ups, exercise bands She estimates to drink > [place]oon water/day and she has lost >30 lbs in last year due to calorie restriction She reports not having a physical in >10 years She reports being on an anti-hypertensive in past She currently lives with her ex-husband and son  Of note- she has a difficult time recalling dates and specific details of her medical hx  Patient Care Team    Relationship Specialty Notifications Start End  Sashia Campas, Jinny Blossom, NP PCP - General Family Medicine  09/17/18     Patient Active Problem List   Diagnosis Date Noted  . Healthcare maintenance 09/17/2018  . DDD (degenerative disc  disease), cervical 09/17/2018  . Fibromyalgia 09/17/2018  . Anxiety 09/17/2018  . 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 Tremor 09/26/2016  . Chronic constipation 02/03/2014  . Arthritis 02/03/2014  . Tobacco use 12/11/2012  . Migraine headache 01/10/2007     Past Medical History:  Diagnosis Date  . Anemia   . Anxiety   . Chest pain    a. Felt noncardiac with normal stress echo 11/2012.  Marland Kitchen Cholelithiasis    a. on Abd [place] 11/2012.  Marland Kitchen Complication of anesthesia   . Depression   . GERD (gastroesophageal reflux disease)   . Hypertension   . Migraines   . PONV (postoperative nausea and vomiting)   . Radiculopathy of cervical region   . Stroke [facility]) 11/13/2005   TIA     Past Surgical History:  Procedure Laterality Date  . CHOLECYSTECTOMY N/A 08/07/2013   Procedure: LAPAROSCOPIC CHOLECYSTECTOMY;  Surgeon: Robyne Askew, MD;  Location: [facility] OR;  Service: General;  Laterality: N/A;  . CLEFT LIP REPAIR     Multiple  . CLEFT PALATE REPAIR     Multiple  . ROTATOR CUFF REPAIR  2010   left  . TUBAL LIGATION  1998     Family History  Problem Relation Age of Onset  . Migraines Mother   . Heart disease Father   . Alcohol abuse Father   . Heart attack Father   . Cancer Maternal Grandmother        ovarian  . Heart disease Maternal Grandmother   . Stroke Maternal  Grandmother   . Depression Maternal Grandmother   . Heart attack Maternal Grandmother   . Hypertension Maternal Grandmother   . Heart disease Maternal Grandfather   . Heart attack Maternal Grandfather   . Heart disease Paternal Grandmother   . Heart attack Paternal Grandmother   . Diabetes Paternal Grandfather   . Stroke Paternal Grandfather   . Cancer Maternal Aunt        breast  . Depression Maternal Aunt   . Heart disease Paternal Uncle   . Diabetes Paternal Uncle   . Stroke Paternal Uncle      Social History   Substance and Sexual Activity  Drug Use No     Social History   Substance and  Sexual Activity  Alcohol Use Yes   Comment: 1-2 a year     Social History   Tobacco Use  Smoking Status Current Some Day Smoker  . Packs/day: 0.10  . Years: 1.00  . Pack years: 0.10  . Types: Cigarettes  . Start date: 04/10/2012  . Last attempt to quit: 08/07/2013  . Years since quitting: 5.1  Smokeless Tobacco Never Used     Outpatient Encounter Medications as of 09/17/2018  Medication Sig Note  . ALPRAZolam (XANAX) 0.5 MG tablet Take 0.5 mg by mouth. One tablet once to twice daily   . cyclobenzaprine (FLEXERIL) 10 MG tablet Take 1 tablet by mouth 2 (two) times daily.   . Vitamin D, Ergocalciferol, (DRISDOL) 50000 units CAPS capsule Take 1 capsule by mouth once a week.   . [DISCONTINUED] OVER THE COUNTER MEDICATION    . [DISCONTINUED] Cholecalciferol (D3 ADULT PO) Take 50,000 Units by mouth.   . [DISCONTINUED] valsartan (DIOVAN) 80 MG tablet TAKE ONE TABLET BY MOUTH ONCE DAILY FOR BLOOD PRESSURE 09/26/2016: Received from: External Pharmacy   No facility-administered encounter medications on file as of 09/17/2018.     Allergies: Other; Latex; Amoxicillin; Contrast media [iodinated diagnostic agents]; Iohexol; Doxycycline; and Erythromycin  Body mass index is 25.96 kg/m.  Blood pressure 127/82, pulse (!) 103, temperature 98.4 F (36.9 C), temperature source Oral, height 5\' 1"  (1.549 m), weight 137 lb 6.4 oz (62.3 kg), last menstrual period 09/04/2018, SpO2 99 %.  Review of Systems  Constitutional: Positive for fatigue. Negative for activity change, appetite change, chills, diaphoresis, fever and unexpected weight change.  Respiratory: Negative for cough, chest tightness, shortness of breath, wheezing and stridor.   Cardiovascular: Negative for chest pain, palpitations and leg swelling.  Gastrointestinal: Negative for abdominal distention, anal bleeding, blood in stool, constipation, diarrhea, nausea and vomiting.  Genitourinary: Negative for difficulty urinating and dysuria.   Musculoskeletal: Positive for arthralgias, back pain, gait problem, joint swelling, myalgias, neck pain and neck stiffness.  Skin: Negative for color change, pallor, rash and wound.  Neurological: Positive for headaches. Negative for dizziness.  Hematological: Does not bruise/bleed easily.  Psychiatric/Behavioral: Positive for dysphoric mood and sleep disturbance. Negative for behavioral problems, confusion, decreased concentration, hallucinations, self-injury and suicidal ideas. The patient is nervous/anxious. The patient is not hyperactive.        Objective:   Physical Exam  Constitutional: She is oriented to person, place, and time. She appears well-developed and well-nourished. No distress.  HENT:  Head: Normocephalic and atraumatic.  Right Ear: External ear normal.  Left Ear: External ear normal.  Nose: Nose normal.  Mouth/Throat: Oropharynx is clear and moist.  Cardiovascular: Regular rhythm, normal heart sounds and intact distal pulses. Tachycardia present.  No murmur heard. Pulmonary/Chest: Effort normal and  breath sounds normal. No stridor. No respiratory distress. She has no wheezes. She has no rales. She exhibits no tenderness.  Neurological: She is alert and oriented to person, place, and time.  Skin: Skin is warm and dry. Capillary refill takes less than 2 seconds. No rash noted. She is not diaphoretic. No erythema. No pallor.  Very warm to the touch  Psychiatric: Her behavior is normal. Judgment and thought content normal. Her mood appears anxious. Her speech is rapid and/or pressured. She exhibits abnormal remote memory.  Nursing note and vitals reviewed.     Assessment & Plan:   1. Fibromyalgia   2. DDD (degenerative disc disease), cervical   3. Healthcare maintenance   4. Anxiety     Healthcare maintenance Pain Clinic referral placed to address degenerative disc disease in cervical neck and lumbar back and fibromyalgia, as well as, prescribe chronic narcotic and  benzodiazapine medications. We will only continue Cyclobenzaprine 10mg  once daily prescription, in terms of chronic pain control. Continue to drink plenty of water and follow heart healthy diet. Continue regular exercise. Stop all tobacco use. Please schedule complete physical in 4 weeks, please schedule fasting lab appt the week prior.  DDD (degenerative disc disease), cervical [place] Controlled Substance Database reviewed. Regular Refills of Hydrocodone, Oxycodone, and Tramadol Referral to Pain Clinic  Anxiety Currently treating with Alprazolam 0.5mg  TID Refused re-starting Fluoxetine Refused Behavorial Health referral  Discussed that we will not be continuing therapy  Pt was in the office today for 45+ minutes, I spent >75% of  time  in face to face counseling of various medical concerns and in coordination of care  FOLLOW-UP:  Return in about 4 weeks (around 10/15/2018) for CPE, Fasting Labs.

## 2018-09-17 ENCOUNTER — Encounter: Payer: Self-pay | Admitting: Adult Health

## 2018-09-17 ENCOUNTER — Ambulatory Visit (INDEPENDENT_AMBULATORY_CARE_PROVIDER_SITE_OTHER): Payer: Self-pay | Admitting: Adult Health

## 2018-09-17 VITALS — BP 127/82 | HR 103 | Temp 98.4°F | Ht 61.0 in | Wt 137.4 lb

## 2018-09-17 DIAGNOSIS — F419 Anxiety disorder, unspecified: Secondary | ICD-10-CM | POA: Insufficient documentation

## 2018-09-17 DIAGNOSIS — M5030 Other cervical disc degeneration, unspecified cervical region: Secondary | ICD-10-CM

## 2018-09-17 DIAGNOSIS — Z0000 Encounter for general adult medical examination without abnormal findings: Secondary | ICD-10-CM | POA: Insufficient documentation

## 2018-09-17 DIAGNOSIS — M797 Fibromyalgia: Secondary | ICD-10-CM

## 2018-09-17 NOTE — Patient Instructions (Addendum)
Mediterranean Diet A Mediterranean diet refers to food and lifestyle choices that are based on the traditions of countries located on the [place] Sea. This way of eating has been shown to help prevent certain conditions and improve outcomes for people who have chronic diseases, like kidney disease and heart disease. What are tips for following this plan? Lifestyle  Cook and eat meals together with your family, when possible.  Drink enough fluid to keep your urine clear or pale yellow.  Be physically active every day. This includes: ? Aerobic exercise like running or swimming. ? Leisure activities like gardening, walking, or housework.  Get 7-8 hours of sleep each night.  If recommended by your health care provider, drink red wine in moderation. This means 1 glass a day for nonpregnant women and 2 glasses a day for men. A glass of wine equals 5 oz (150 mL). Reading food labels  Check the serving size of packaged foods. For foods such as rice and pasta, the serving size refers to the amount of cooked product, not dry.  Check the total fat in packaged foods. Avoid foods that have saturated fat or trans fats.  Check the ingredients list for added sugars, such as corn syrup. Shopping  At the grocery store, buy most of your food from the areas near the walls of the store. This includes: ? Fresh fruits and vegetables (produce). ? Grains, beans, nuts, and seeds. Some of these may be available in unpackaged forms or large amounts (in bulk). ? Fresh seafood. ? Poultry and eggs. ? Low-fat dairy products.  Buy whole ingredients instead of prepackaged foods.  Buy fresh fruits and vegetables in-season from local farmers markets.  Buy frozen fruits and vegetables in resealable bags.  If you do not have access to quality fresh seafood, buy precooked frozen shrimp or canned fish, such as tuna, salmon, or sardines.  Buy small amounts of raw or cooked vegetables, salads, or olives from the  deli or salad bar at your store.  Stock your pantry so you always have certain foods on hand, such as olive oil, canned tuna, canned tomatoes, rice, pasta, and beans. Cooking  Cook foods with extra-virgin olive oil instead of using butter or other vegetable oils.  Have meat as a side dish, and have vegetables or grains as your main dish. This means having meat in small portions or adding small amounts of meat to foods like pasta or stew.  Use beans or vegetables instead of meat in common dishes like chili or lasagna.  Experiment with different cooking methods. Try roasting or broiling vegetables instead of steaming or sauteing them.  Add frozen vegetables to soups, stews, pasta, or rice.  Add nuts or seeds for added healthy fat at each meal. You can add these to yogurt, salads, or vegetable dishes.  Marinate fish or vegetables using olive oil, lemon juice, garlic, and fresh herbs. Meal planning  Plan to eat 1 vegetarian meal one day each week. Try to work up to 2 vegetarian meals, if possible.  Eat seafood 2 or more times a week.  Have healthy snacks readily available, such as: ? Vegetable sticks with hummus. ? Greek yogurt. ? Fruit and nut trail mix.  Eat balanced meals throughout the week. This includes: ? Fruit: 2-3 servings a day ? Vegetables: 4-5 servings a day ? Low-fat dairy: 2 servings a day ? Fish, poultry, or lean meat: 1 serving a day ? Beans and legumes: 2 or more servings a week ? Nuts   and seeds: 1-2 servings a day ? Whole grains: 6-8 servings a day ? Extra-virgin olive oil: 3-4 servings a day  Limit red meat and sweets to only a few servings a month What are my food choices?  Mediterranean diet ? Recommended ? Grains: Whole-grain pasta. Brown rice. Bulgar wheat. Polenta. Couscous. Whole-wheat bread. Orpah Cobb. ? Vegetables: Artichokes. Beets. Broccoli. Cabbage. Carrots. Eggplant. Green beans. Chard. Kale. Spinach. Onions. Leeks. Peas. Squash.  Tomatoes. Peppers. Radishes. ? Fruits: Apples. Apricots. Avocado. Berries. Bananas. Cherries. Dates. Figs. Grapes. Lemons. Melon. Oranges. Peaches. Plums. Pomegranate. ? Meats and other protein foods: Beans. Almonds. Sunflower seeds. Pine nuts. Peanuts. Cod. Salmon. Scallops. Shrimp. Tuna. Tilapia. Clams. Oysters. Eggs. ? Dairy: Low-fat milk. Cheese. Greek yogurt. ? Beverages: Water. Red wine. Herbal tea. ? Fats and oils: Extra virgin olive oil. Avocado oil. Grape seed oil. ? Sweets and desserts: [place] yogurt with honey. Baked apples. Poached pears. Trail mix. ? Seasoning and other foods: Basil. Cilantro. Coriander. Cumin. Mint. Parsley. Sage. Rosemary. Tarragon. Garlic. Oregano. Thyme. Pepper. Balsalmic vinegar. Tahini. Hummus. Tomato sauce. Olives. Mushrooms. ? Limit these ? Grains: Prepackaged pasta or rice dishes. Prepackaged cereal with added sugar. ? Vegetables: Deep fried potatoes (french fries). ? Fruits: Fruit canned in syrup. ? Meats and other protein foods: Beef. Pork. Lamb. Poultry with skin. Hot dogs. Tomasa Blase. ? Dairy: Ice cream. Sour cream. Whole milk. ? Beverages: Juice. Sugar-sweetened soft drinks. Beer. Liquor and spirits. ? Fats and oils: Butter. Canola oil. Vegetable oil. Beef fat (tallow). Lard. ? Sweets and desserts: Cookies. Cakes. Pies. Candy. ? Seasoning and other foods: Mayonnaise. Premade sauces and marinades. ? The items listed may not be a complete list. Talk with your dietitian about what dietary choices are right for you. Summary  The Mediterranean diet includes both food and lifestyle choices.  Eat a variety of fresh fruits and vegetables, beans, nuts, seeds, and whole grains.  Limit the amount of red meat and sweets that you eat.  Talk with your health care provider about whether it is safe for you to drink red wine in moderation. This means 1 glass a day for nonpregnant women and 2 glasses a day for men. A glass of wine equals 5 oz (150 mL). This information  is not intended to replace advice given to you by your health care provider. Make sure you discuss any questions you have with your health care provider. Document Released: 06/22/2016 Document Revised: 07/25/2016 Document Reviewed: 06/22/2016 Elsevier Interactive Patient Education  2018 [employer].   Chronic Pain, Adult Chronic pain is a type of pain that lasts or keeps coming back (recurs) for at least six months. You may have chronic headaches, abdominal pain, or body pain. Chronic pain may be related to an illness, such as fibromyalgia or complex regional pain syndrome. Sometimes the cause of chronic pain is not known. Chronic pain can make it hard for you to do daily activities. If not treated, chronic pain can lead to other health problems, including anxiety and depression. Treatment depends on the cause and severity of your pain. You may need to work with a pain specialist to come up with a treatment plan. The plan may include medicine, counseling, and physical therapy. Many people benefit from a combination of two or more types of treatment to control their pain. Follow these instructions at home: Lifestyle  Consider keeping a pain diary to share with your health care providers.  Consider talking with a mental health care provider (psychologist) about how to cope  with chronic pain.  Consider joining a chronic pain support group.  Try to control or lower your stress levels. Talk to your health care provider about strategies to do this. General instructions   Take over-the-counter and prescription medicines only as told by your health care provider.  Follow your treatment plan as told by your health care provider. This may include: ? Gentle, regular exercise. ? Eating a healthy diet that includes foods such as vegetables, fruits, fish, and lean meats. ? Cognitive or behavioral therapy. ? Working with a Adult nurse. ? Meditation or yoga. ? Acupuncture or massage  therapy. ? Aroma, color, light, or sound therapy. ? Local electrical stimulation. ? Shots (injections) of numbing or pain-relieving medicines into the spine or the area of pain.  Check your pain level as told by your health care provider. Ask your health care provider if you should use a pain scale.  Learn as much as you can about how to manage your chronic pain. Ask your health care provider if an intensive pain rehabilitation program or a chronic pain specialist would be helpful.  Keep all follow-up visits as told by your health care provider. This is important. Contact a health care provider if:  Your pain gets worse.  You have new pain.  You have trouble sleeping.  You have trouble doing your normal activities.  Your pain is not controlled with treatment.  Your have side effects from pain medicine.  You feel weak. Get help right away if:  You lose feeling or have numbness in your body.  You lose control of bowel or bladder function.  Your pain suddenly gets much worse.  You develop shaking or chills.  You develop confusion.  You develop chest pain.  You have trouble breathing or shortness of breath.  You pass out.  You have thoughts about hurting yourself or others. This information is not intended to replace advice given to you by your health care provider. Make sure you discuss any questions you have with your health care provider. Document Released: 07/22/2002 Document Revised: 06/29/2016 Document Reviewed: 04/18/2016 Elsevier Interactive Patient Education  2018 [employer] Inc[employer]  Pain Clinic referral placed to address degenerative disc disease in cervical neck and lumbar back and fibromyalgia, as well as, prescribe chronic narcotic and benzodiazapine medications. We will only continue Cyclobenzaprine 10mg  once daily prescription, in terms of chronic pain control. Continue to drink plenty of water and follow heart healthy diet. Continue regular exercise. Stop  all tobacco use. Please schedule complete physical in 4 weeks, please schedule fasting lab appt the week prior. WELCOME TO THE PRACTICE!

## 2018-09-17 NOTE — Assessment & Plan Note (Signed)
Currently treating with Alprazolam 0.5mg  TID Refused re-starting Fluoxetine Refused Behavorial Health referral  Discussed that we will not be continuing therapy

## 2018-09-17 NOTE — Assessment & Plan Note (Signed)
Pain Clinic referral placed to address degenerative disc disease in cervical neck and lumbar back and fibromyalgia, as well as, prescribe chronic narcotic and benzodiazapine medications. We will only continue Cyclobenzaprine 10mg  once daily prescription, in terms of chronic pain control. Continue to drink plenty of water and follow heart healthy diet. Continue regular exercise. Stop all tobacco use. Please schedule complete physical in 4 weeks, please schedule fasting lab appt the week prior.

## 2018-09-17 NOTE — Assessment & Plan Note (Signed)
[place] Controlled Substance Database reviewed. Regular Refills of Hydrocodone, Oxycodone, and Tramadol Referral to Pain Clinic

## 2018-10-16 ENCOUNTER — Other Ambulatory Visit: Payer: Self-pay

## 2018-10-23 ENCOUNTER — Encounter: Payer: Self-pay | Admitting: Adult Health

## 2022-06-29 ENCOUNTER — Telehealth: Payer: Self-pay | Admitting: Genetic Counselor

## 2022-06-29 NOTE — Telephone Encounter (Signed)
Scheduled appt per 8/17 referral. Pt is aware of appt date and time. Pt is aware to arrive 15 mins prior to appt time and to bring and updated insurance card. Pt is aware of appt location.   

## 2022-08-07 ENCOUNTER — Encounter: Payer: Self-pay | Admitting: Neurology

## 2022-08-17 ENCOUNTER — Encounter: Payer: Self-pay | Admitting: Genetic Counselor

## 2022-08-17 ENCOUNTER — Other Ambulatory Visit (INDEPENDENT_AMBULATORY_CARE_PROVIDER_SITE_OTHER): Payer: Self-pay | Admitting: Adult Health

## 2022-08-17 ENCOUNTER — Other Ambulatory Visit: Payer: Self-pay

## 2022-08-21 ENCOUNTER — Encounter (INDEPENDENT_AMBULATORY_CARE_PROVIDER_SITE_OTHER): Payer: Self-pay | Admitting: Adult Health

## 2022-09-23 LAB — COLOGUARD: COLOGUARD: NEGATIVE

## 2022-10-17 ENCOUNTER — Other Ambulatory Visit: Payer: BC Managed Care – PPO

## 2022-10-17 ENCOUNTER — Inpatient Hospital Stay: Payer: BC Managed Care – PPO | Attending: Student | Admitting: Genetic Counselor

## 2022-10-17 ENCOUNTER — Inpatient Hospital Stay: Payer: BC Managed Care – PPO

## 2022-10-17 DIAGNOSIS — Z8041 Family history of malignant neoplasm of ovary: Secondary | ICD-10-CM | POA: Diagnosis not present

## 2022-10-17 DIAGNOSIS — Z803 Family history of malignant neoplasm of breast: Secondary | ICD-10-CM | POA: Diagnosis not present

## 2022-10-17 LAB — GENETIC SCREENING ORDER

## 2022-10-18 ENCOUNTER — Encounter: Payer: Self-pay | Admitting: Genetic Counselor

## 2022-10-18 NOTE — Progress Notes (Signed)
REFERRING PROVIDER: Ricard Dillon, NP [address] [place],  [place] [address]  PRIMARY PROVIDER:  Default, Provider, MD  PRIMARY REASON FOR VISIT:  1. Family history of ovarian cancer   2. Family history of breast cancer     HISTORY OF PRESENT ILLNESS:   [name], a 51 y.o. female, was seen for a [place] cancer genetics consultation at the request of Dr. Jacqulynn Cadet due to a family history of cancer.  [name] presents to clinic today to discuss the possibility of a hereditary predisposition to cancer, to discuss genetic testing, and to further clarify her future cancer risks, as well as potential cancer risks for family members.   [name] is a 51 y.o. female with no personal history of cancer.    RISK FACTORS:  Menarche was at age 62.  First live birth at age 57.  OCP use for approximately 2 years.  Ovaries intact: yes.  Uterus intact: yes.  Menopausal status: postmenopausal, menopause at age 58  HRT use: 0 years. Colonoscopy: no Mammogram within the last year: yes. Any excessive radiation exposure in the past: no  Past Medical History:  Diagnosis Date   Anemia    Anxiety    Chest pain    a. Felt noncardiac with normal stress echo 11/2012.   Cholelithiasis    a. on Abd [place] 11/2012.   Complication of anesthesia    Depression    GERD (gastroesophageal reflux disease)    Hypertension    Migraines    PONV (postoperative nausea and vomiting)    Radiculopathy of cervical region    Stroke [facility]) 11/13/2005   TIA    Past Surgical History:  Procedure Laterality Date   CHOLECYSTECTOMY N/A 08/07/2013   Procedure: LAPAROSCOPIC CHOLECYSTECTOMY;  Surgeon: Merrie Roof, MD;  Location: [facility];  Service: General;  Laterality: N/A;   CLEFT LIP REPAIR     Multiple   CLEFT PALATE REPAIR     Multiple   ROTATOR CUFF REPAIR  2010   left   TUBAL LIGATION  1998    Social History   Socioeconomic History   Marital status: Divorced    Spouse name: Not on file   Number  of children: Not on file   Years of education: Not on file   Highest education level: Not on file  Occupational History   Occupation: Radiation protection practitioner at [employer]  Tobacco Use   Smoking status: Some Days    Packs/day: 0.10    Years: 1.00    Total pack years: 0.10    Types: Cigarettes    Start date: 04/10/2012    Last attempt to quit: 08/07/2013    Years since quitting: 9.2   Smokeless tobacco: Never  Vaping Use   Vaping Use: Never used  Substance and Sexual Activity   Alcohol use: Yes    Comment: 1-2 a year   Drug use: No   Sexual activity: Yes  Other Topics Concern   Not on file  Social History Narrative   Occasionally drinks caffeine beverage    Social Determinants of Health   Financial Resource Strain: Not on file  Food Insecurity: Not on file  Transportation Needs: Not on file  Physical Activity: Not on file  Stress: Not on file  Social Connections: Not on file     FAMILY HISTORY:  We obtained a detailed, 4-generation family history.  Significant diagnoses are listed below:  [name] reports one maternal aunt diagnosed with breast cancer  at an unknown age. This aunt is unavailable for genetic testing. [name]'s maternal grandmother was diagnosed with ovarian cancer and uterine cancer at unknown ages, she is deceased. [name] is unaware of previous family history of genetic testing for hereditary cancer risks. There is no reported Ashkenazi Jewish ancestry. Of note, she has limited information about her paternal family medical history.     GENETIC COUNSELING ASSESSMENT: [name] is a 51 y.o. female with a family history of cancer which is somewhat suggestive of a hereditary predisposition to cancer. We, therefore, discussed and recommended the following at today's visit.   DISCUSSION: We discussed that 5 - 10% of cancer is hereditary, with most cases of hereditary breast and ovarian cancer associated with BRCA1/2.  There are other genes that can be  associated with hereditary breast and ovarian cancer syndromes.  We discussed that testing is beneficial for several reasons, including knowing about other cancer risks, identifying potential screening and risk-reduction options that may be appropriate, and to understanding if other family members could be at risk for cancer and allowing them to undergo genetic testing.  We reviewed the characteristics, features and inheritance patterns of hereditary cancer syndromes. We also discussed genetic testing, including the appropriate family members to test, the process of testing, insurance coverage and turn-around-time for results. We discussed the implications of a negative, positive, carrier and/or variant of uncertain significant result. We discussed that negative results would be uninformative given that [name] does not have a personal history of cancer. We recommended [name] pursue genetic testing for a panel that contains genes associated with breast and ovarian cancer.  [name] was offered a common hereditary cancer panel (47 genes) and an expanded pan-cancer panel (77 genes). [name] was informed of the benefits and limitations of each panel, including that expanded pan-cancer panels contain several genes that do not have clear management guidelines at this point in time.  We also discussed that as the number of genes included on a panel increases, the chances of variants of uncertain significance increases.  After considering the benefits and limitations of each gene panel, [name] elected to have [facility] CancerNext-Expanded Panel.  The CancerNext-Expanded gene panel offered by [facility] and includes sequencing, rearrangement, and RNA analysis for the following 77 genes: AIP, ALK, APC, ATM, AXIN2, BAP1, BARD1, BLM, BMPR1A, BRCA1, BRCA2, BRIP1, CDC73, CDH1, CDK4, CDKN1B, CDKN2A, CHEK2, CTNNA1, DICER1, FANCC, FH, FLCN, GALNT12, KIF1B, LZTR1, MAX, MEN1, MET, MLH1, MSH2, MSH3,  MSH6, MUTYH, NBN, NF1, NF2, NTHL1, PALB2, PHOX2B, PMS2, POT1, PRKAR1A, PTCH1, PTEN, RAD51C, RAD51D, RB1, RECQL, RET, SDHA, SDHAF2, SDHB, SDHC, SDHD, SMAD4, SMARCA4, SMARCB1, SMARCE1, STK11, SUFU, TMEM127, TP53, TSC1, TSC2, VHL and XRCC2 (sequencing and deletion/duplication); EGFR, EGLN1, HOXB13, KIT, MITF, PDGFRA, POLD1, and POLE (sequencing only); EPCAM and GREM1 (deletion/duplication only).    Based on [name]'s family history of cancer, she meets medical criteria for genetic testing. Despite that she meets criteria, she may still have an out of pocket cost. We discussed that if her out of pocket cost for testing is over $100, the laboratory will call and confirm whether she wants to proceed with testing.  If the out of pocket cost of testing is less than $100 she will be billed by the genetic testing laboratory.   We discussed that some people do not want to undergo genetic testing due to fear of genetic discrimination.  A federal law called the Genetic Information Non-Discrimination Act (GINA) of 2008 helps protect individuals against  genetic discrimination based on their genetic test results.  It impacts both health insurance and employment.  With health insurance, it protects against increased premiums, being kicked off insurance or being forced to take a test in order to be insured.  For employment it protects against hiring, firing and promoting decisions based on genetic test results.  GINA does not apply to those in the [employer], those who work for companies with less than 15 employees, and new life insurance or long-term disability insurance policies.  Health status due to a cancer diagnosis is not protected under GINA.  PLAN: After considering the risks, benefits, and limitations, [name] provided informed consent to pursue genetic testing and the blood sample was sent to [facility] for analysis of the CancerNext-Expanded Panel. Results should be available within approximately 2-3  weeks' time, at which point they will be disclosed by telephone to [name], as will any additional recommendations warranted by these results. [name] will receive a summary of her genetic counseling visit and a copy of her results once available. This information will also be available in Epic.   [name]'s questions were answered to her satisfaction today. Our contact information was provided should additional questions or concerns arise. Thank you for the referral and allowing [place] to share in the care of your patient.   Rebecca Passy, MS, [facility] Genetic Counselor [email or web address] (P) [phone]  The patient was seen for a total of 40 minutes in face-to-face genetic counseling. The patient was seen alone.  Drs. Lindi Adie and/or Burr Medico were available to discuss this case as needed.  _______________________________________________________________________ For Office Staff:  Number of people involved in session: 1 Was an Intern/ student involved with case: no

## 2022-10-31 ENCOUNTER — Ambulatory Visit: Payer: BC Managed Care – PPO | Admitting: Physical Therapy

## 2022-11-10 ENCOUNTER — Encounter: Payer: Self-pay | Admitting: Genetic Counselor

## 2022-11-10 ENCOUNTER — Telehealth: Payer: Self-pay | Admitting: Genetic Counselor

## 2022-11-10 DIAGNOSIS — Z1379 Encounter for other screening for genetic and chromosomal anomalies: Secondary | ICD-10-CM | POA: Insufficient documentation

## 2022-11-10 NOTE — Telephone Encounter (Addendum)
I contacted [name] to discuss her genetic testing results. No pathogenic variants were identified in the 77 genes analyzed. 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 Detailed clinic note to follow.  The test report has been scanned into EPIC and is located under the Molecular Pathology section of the Results Review tab.  A portion of the result report is included below for reference.   Rebecca Brothers, MS, [facility] Genetic Counselor [email or web address] (P) [phone]

## 2022-11-14 NOTE — Therapy (Deleted)
OUTPATIENT PHYSICAL THERAPY FEMALE PELVIC EVALUATION   Patient Name: [name] MRN: [id number] DOB:[birth year], 52 y.o., female Today's Date: 11/14/2022  END OF SESSION:   Past Medical History:  Diagnosis Date   Anemia    Anxiety    Chest pain    a. Felt noncardiac with normal stress echo 11/2012.   Cholelithiasis    a. on Abd [place] 11/2012.   Complication of anesthesia    Depression    GERD (gastroesophageal reflux disease)    Hypertension    Migraines    PONV (postoperative nausea and vomiting)    Radiculopathy of cervical region    Stroke [facility]) 11/13/2005   TIA   Past Surgical History:  Procedure Laterality Date   CHOLECYSTECTOMY N/A 08/07/2013   Procedure: LAPAROSCOPIC CHOLECYSTECTOMY;  Surgeon: Robyne Askew, MD;  Location: [facility] OR;  Service: General;  Laterality: N/A;   CLEFT LIP REPAIR     Multiple   CLEFT PALATE REPAIR     Multiple   ROTATOR CUFF REPAIR  2010   left   TUBAL LIGATION  1998   Patient Active Problem List   Diagnosis Date Noted   Genetic testing 11/10/2022   Healthcare maintenance 09/17/2018   DDD (degenerative disc disease), cervical 09/17/2018   Fibromyalgia 09/17/2018   Anxiety 09/17/2018   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  Rationale for Evaluation and Treatment: Rehabilitation  ONSET DATE: ***  SUBJECTIVE:                                                                                                                                                                                           SUBJECTIVE STATEMENT: *** Fluid intake: {Yes/No:304960894}   PAIN:  Are you having pain? {yes/no:20286} NPRS scale: ***/10 Pain location:  {pelvic pain location:27098}  Pain type: {type:313116} Pain description: {PAIN DESCRIPTION:21022940}   Aggravating factors: *** Relieving factors: ***  PRECAUTIONS: None  WEIGHT BEARING RESTRICTIONS: No  FALLS:  Has patient fallen in last 6 months? {fallsyesno:27318}  LIVING ENVIRONMENT: Lives with: {OPRC lives with:25569::"lives with their family"} Lives in: {Lives in:25570} Stairs: {opstairs:27293} Has following equipment at home: {Assistive devices:23999}  OCCUPATION: ***  PLOF: {PLOF:24004}  PATIENT GOALS: ***  PERTINENT HISTORY:  Stroke 11/13/2005; Cholecystectomy 08/07/13 Sexual abuse: {Yes/No:304960894}  BOWEL MOVEMENT: Pain with bowel movement: {yes/no:20286} Type of bowel movement:{PT BM  type:27100} Fully empty rectum: {Yes/No:304960894} Leakage: {Yes/No:304960894} Pads: {Yes/No:304960894} Fiber supplement: {Yes/No:304960894}  URINATION: Pain with urination: {yes/no:20286} Fully empty bladder: {Yes/No:304960894} Stream: {PT urination:27102} Urgency: {Yes/No:304960894} Frequency: *** Leakage: {PT leakage:27103} Pads: {Yes/No:304960894}  INTERCOURSE: Pain with intercourse: {pain with intercourse PA:27099} Ability to have vaginal penetration:  {Yes/No:304960894} Climax: *** Marinoff Scale: ***/3  PREGNANCY: Vaginal deliveries *** Tearing {Yes***/No:304960894} C-section deliveries *** Currently pregnant {Yes***/No:304960894}  PROLAPSE: {PT prolapse:27101}   OBJECTIVE:   DIAGNOSTIC FINDINGS:  ***  PATIENT SURVEYS:  {rehab surveys:24030}  PFIQ-7 ***  COGNITION: Overall cognitive status: {cognition:24006}     SENSATION: Light touch: {intact/deficits:24005} Proprioception: {intact/deficits:24005}  MUSCLE LENGTH: Hamstrings: Right *** deg; Left *** deg Thomas test: Right *** deg; Left *** deg  LUMBAR SPECIAL TESTS:  {lumbar special test:25242}  FUNCTIONAL TESTS:  {Functional tests:24029}  GAIT: Distance walked: *** Assistive  device utilized: {Assistive devices:23999} Level of assistance: {Levels of assistance:24026} Comments: ***  POSTURE: {posture:25561}  PELVIC ALIGNMENT:  LUMBARAROM/PROM:  A/PROM A/PROM  eval  Flexion   Extension   Right lateral flexion   Left lateral flexion   Right rotation   Left rotation    (Blank rows = not tested)  LOWER EXTREMITY ROM:  {AROM/PROM:27142} ROM Right eval Left eval  Hip flexion    Hip extension    Hip abduction    Hip adduction    Hip internal rotation    Hip external rotation    Knee flexion    Knee extension    Ankle dorsiflexion    Ankle plantarflexion    Ankle inversion    Ankle eversion     (Blank rows = not tested)  LOWER EXTREMITY MMT:  MMT Right eval Left eval  Hip flexion    Hip extension    Hip abduction    Hip adduction    Hip internal rotation    Hip external rotation    Knee flexion    Knee extension    Ankle dorsiflexion    Ankle plantarflexion    Ankle inversion    Ankle eversion     PALPATION:   General  ***                External Perineal Exam ***                             Internal Pelvic Floor ***  Patient confirms identification and approves PT to assess internal pelvic floor and treatment {yes/no:20286}  PELVIC MMT:   MMT eval  Vaginal   Internal Anal Sphincter   External Anal Sphincter   Puborectalis   Diastasis Recti   (Blank rows = not tested)        TONE: ***  PROLAPSE: ***  TODAY'S TREATMENT:  DATE: ***  EVAL ***   PATIENT EDUCATION:  Education details: *** Person educated: {Person educated:25204} Education method: {Education Method:25205} Education comprehension: {Education Comprehension:25206}  HOME EXERCISE PROGRAM: ***  ASSESSMENT:  CLINICAL IMPRESSION: Patient is a *** y.o. *** who was seen today for physical therapy evaluation and treatment for  ***.   OBJECTIVE IMPAIRMENTS: {opptimpairments:25111}.   ACTIVITY LIMITATIONS: {activitylimitations:27494}  PARTICIPATION LIMITATIONS: {participationrestrictions:25113}  PERSONAL FACTORS: {Personal factors:25162} are also affecting patient's functional outcome.   REHAB POTENTIAL: {rehabpotential:25112}  CLINICAL DECISION MAKING: {clinical decision making:25114}  EVALUATION COMPLEXITY: {Evaluation complexity:25115}   GOALS: Goals reviewed with patient? {yes/no:20286}  SHORT TERM GOALS: Target date: ***  *** Baseline: Goal status: {GOALSTATUS:25110}  2.  *** Baseline:  Goal status: {GOALSTATUS:25110}  3.  *** Baseline:  Goal status: {GOALSTATUS:25110}  4.  *** Baseline:  Goal status: {GOALSTATUS:25110}  5.  *** Baseline:  Goal status: {GOALSTATUS:25110}  6.  *** Baseline:  Goal status: {GOALSTATUS:25110}  LONG TERM GOALS: Target date: ***  *** Baseline:  Goal status: {GOALSTATUS:25110}  2.  *** Baseline:  Goal status: {GOALSTATUS:25110}  3.  *** Baseline:  Goal status: {GOALSTATUS:25110}  4.  *** Baseline:  Goal status: {GOALSTATUS:25110}  5.  *** Baseline:  Goal status: {GOALSTATUS:25110}  6.  *** Baseline:  Goal status: {GOALSTATUS:25110}  PLAN:  PT FREQUENCY: {rehab frequency:25116}  PT DURATION: {rehab duration:25117}  PLANNED INTERVENTIONS: {rehab planned interventions:25118::"Therapeutic exercises","Therapeutic activity","Neuromuscular re-education","Balance training","Gait training","Patient/Family education","Self Care","Joint mobilization"}  PLAN FOR NEXT SESSION: ***   Talis Iwan, PT 11/14/2022, 9:20 AM

## 2022-11-15 ENCOUNTER — Ambulatory Visit: Payer: BC Managed Care – PPO | Admitting: Physical Therapy

## 2022-11-17 ENCOUNTER — Ambulatory Visit: Payer: Self-pay | Admitting: Genetic Counselor

## 2022-11-17 ENCOUNTER — Encounter: Payer: Self-pay | Admitting: Genetic Counselor

## 2022-11-17 DIAGNOSIS — Z1379 Encounter for other screening for genetic and chromosomal anomalies: Secondary | ICD-10-CM

## 2022-11-17 NOTE — Progress Notes (Signed)
HPI:   [name] was previously seen in the [facility] clinic due to [name] family history of cancer and concerns regarding [name] hereditary predisposition to cancer. Please refer to our prior cancer genetics clinic note for more information regarding our discussion, assessment and recommendations, at the time. [name]'s recent genetic test results were disclosed to her, as were recommendations warranted by these results. These results and recommendations are discussed in more detail below.  FAMILY HISTORY:  We obtained [name] detailed, 4-generation family history.  Significant diagnoses are listed below:   [name] reports one maternal aunt diagnosed with breast cancer at an unknown age. This aunt is unavailable for genetic testing. [name]'s maternal grandmother was diagnosed with ovarian cancer and uterine cancer at unknown ages, she is deceased. [name] is unaware of previous family history of genetic testing for hereditary cancer risks. There is no reported Ashkenazi Jewish ancestry. Of note, she has limited information about her paternal family medical history.         GENETIC TEST RESULTS:  The [facility] CancerNext-Expanded Panel found no pathogenic mutations.  The CancerNext-Expanded gene panel offered by [facility] and includes sequencing, rearrangement, and RNA analysis for the following 77 genes: AIP, ALK, APC, ATM, AXIN2, BAP1, BARD1, BLM, BMPR1A, BRCA1, BRCA2, BRIP1, CDC73, CDH1, CDK4, CDKN1B, CDKN2A, CHEK2, CTNNA1, DICER1, FANCC, FH, FLCN, GALNT12, KIF1B, LZTR1, MAX, MEN1, MET, MLH1, MSH2, MSH3, MSH6, MUTYH, NBN, NF1, NF2, NTHL1, PALB2, PHOX2B, PMS2, POT1, PRKAR1A, PTCH1, PTEN, RAD51C, RAD51D, RB1, RECQL, RET, SDHA, SDHAF2, SDHB, SDHC, SDHD, SMAD4, SMARCA4, SMARCB1, SMARCE1, STK11, SUFU, TMEM127, TP53, TSC1, TSC2, VHL and XRCC2 (sequencing and deletion/duplication); EGFR, EGLN1, HOXB13, KIT, MITF, PDGFRA, POLD1, and POLE (sequencing only); EPCAM and GREM1  (deletion/duplication only).   The test report has been scanned into EPIC and is located under the Molecular Pathology section of the Results Review tab.  [name] portion of the result report is included below for reference. Genetic testing reported out on 11/09/2022.       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  At this time, it is unknown if this variant is associated with an increased risk for cancer or if it is benign, but most uncertain variants are reclassified to benign. It should not be used to make medical management decisions. With time, we suspect the laboratory will determine the significance of this variant, if any. If the laboratory reclassifies this variant, we will attempt to contact [name] to discuss it further.   Even though [name] pathogenic variant was not identified, possible explanations for the cancer in the family may include: There may be no hereditary risk for cancer in the family. The cancers in her family may be due to other genetic or environmental factors. There may be [name] gene mutation in one of these genes that current testing methods cannot detect, but that chance is small. There could be another gene that has not yet been discovered, or that we have not yet tested, that is responsible for the cancer diagnoses in the family.  It is also possible there is [name] hereditary cause for the cancer in the family that [name] did not inherit.  Therefore, it is important to remain in touch with cancer genetics in the future so that we can continue to offer [name] the most up to date genetic testing.   ADDITIONAL GENETIC TESTING:  We discussed with [name] that her genetic testing was fairly extensive.  If there are genes identified to increase cancer risk that can be analyzed in the future, we would be happy to discuss and coordinate this testing at that time.    CANCER SCREENING RECOMMENDATIONS:  Ms.  [name]'s test result is considered negative (normal).  This means that we have not identified [name] hereditary cause for her family history of cancer at this time.   An individual's cancer risk and medical management are not determined by genetic test results alone. Overall cancer risk assessment incorporates additional factors, including personal medical history, family history, and any available genetic information that may result in [name] personalized plan for cancer prevention and surveillance. Therefore, it is recommended she continue to follow the cancer management and screening guidelines provided by her primary healthcare provider.  Based on the reported personal and family history, specific cancer screenings for [name] and her family include:  Breast Cancer Screening:  The Tyrer-Cuzick model is one of multiple prediction models developed to estimate an individual's lifetime risk of developing breast cancer. The Tyrer-Cuzick model is endorsed by the [employer]  (NCCN). This model includes many risk factors such as family history, endogenous estrogen exposure, and benign breast disease. The calculation is highly-dependent on the accuracy of clinical data provided by the patient and can change over time. The Tyrer-Cuzick model may be repeated to reflect new information in her personal or family history in the future. [name]'s Tyrer-Cuzick risk score is 9.2%. We recommend [name] continue her annual mammograms.  RECOMMENDATIONS FOR FAMILY MEMBERS:   Since she did not inherit [name] mutation in [name] cancer predisposition gene included on this panel, her children could not have inherited [name] mutation from her in one of these genes. Individuals in this family might be at some increased risk of developing cancer, over the general population risk, due to the family history of cancer. We recommend women in this family have [name] yearly mammogram beginning at age 56, or 49 years  younger than the earliest onset of cancer, an annual clinical breast exam, and perform monthly breast self-exams.  Other members of the family may still carry [name] pathogenic variant in one of these genes that Ms. Marriott did not inherit. Based on the family history, we recommend her maternal aunt who has [name] history of breast cancer have genetic counseling and testing.  We do not recommend familial testing for the PTCH1 variant of uncertain significance (VUS).  FOLLOW-UP:  Cancer genetics is [name] rapidly advancing field and it is possible that new genetic tests will be appropriate for her and/or her family members in the future. We encouraged her to remain in contact with cancer genetics on an annual basis so we can update her personal and family histories and let her know of advances in cancer genetics that may benefit this family.   Our contact number was provided. Ms. Breshears's questions were answered to her satisfaction, and she knows she is welcome to call us at anytime with additional questions or concerns.   Rebecca Passy, MS, [facility] Genetic Counselor [email or web address] (P) [phone]
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
## 2022-11-27 ENCOUNTER — Other Ambulatory Visit: Payer: Self-pay

## 2022-11-27 ENCOUNTER — Ambulatory Visit: Payer: BC Managed Care – PPO | Attending: Obstetrics and Gynecology | Admitting: Physical Therapy

## 2022-11-27 ENCOUNTER — Encounter: Payer: Self-pay | Admitting: Physical Therapy

## 2022-11-27 DIAGNOSIS — R102 Pelvic and perineal pain: Secondary | ICD-10-CM | POA: Diagnosis present

## 2022-11-27 DIAGNOSIS — M6281 Muscle weakness (generalized): Secondary | ICD-10-CM | POA: Diagnosis present

## 2022-11-27 DIAGNOSIS — M5459 Other low back pain: Secondary | ICD-10-CM | POA: Insufficient documentation

## 2022-11-27 DIAGNOSIS — R252 Cramp and spasm: Secondary | ICD-10-CM | POA: Diagnosis present

## 2022-12-19 ENCOUNTER — Ambulatory Visit: Payer: Self-pay | Admitting: Neurology

## 2022-12-25 NOTE — Progress Notes (Unsigned)
NEUROLOGY CONSULTATION NOTE  [name] MRN: [id number] DOB: [birth year]  Referring provider: Lupe Carney, NP *** Primary care provider: ***  Reason for consult:  headache  Assessment/Plan:   ***   Subjective:  [name] is a 52 year old female with HTN, anxiety, and history of psychogenic movements who presents for migraines.  History supplemented by prior neurologist's notes.  MRI of brain and C-spine personally reviewed.  Onset:  *** Location:  *** Quality:  *** Intensity:  ***.  *** denies new headache, thunderclap headache or severe headache that wakes *** from sleep. Aura:  *** Prodrome:  *** Postdrome:  *** Associated symptoms:  ***.  *** denies associated unilateral numbness or weakness. Duration:  *** Frequency:  *** Frequency of abortive medication: *** Triggers:  *** Relieving factors:  *** Activity:  ***  10/11/2016 MRI BRAIN W WO:  1.   Single T2/FLAIR hyperintense focus in the white matter of the right frontal lobe that was also seen on the 2010 MRI.    This is unlikely to be of clinical significance and likely represents a small chronic microvascular ischemic focus.  2.    There are no acute findings and there is a normal enhancement pattern. 10/13/2016 MRI C-SPINE WO:  1. No evidence of myelopathy.  2. Upper cervical facet arthropathy and mid cervical disc degeneration as described.  3. 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 C5-6 moderate left foraminal narrowing from uncovertebral spur.  4. 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  Past NSAIDS/analgesics:  ibuprofen, naproxen, acetaminophen, Excedrin, Goody Past abortive triptans:  sumatriptan tab Past abortive ergotamine:  *** Past muscle relaxants:  *** Past anti-emetic:  promethazine Past antihypertensive medications:  valsartan Past antidepressant medications:  *** Past anticonvulsant medications:  *** Past anti-CGRP:  *** Past vitamins/Herbal/Supplements:  *** Past  antihistamines/decongestants:  Benadryl Other past therapies:  ***  Current NSAIDS/analgesics:  celecoxib 1063m daily Current triptans:  none Current ergotamine:  none Current anti-emetic:  none Current muscle relaxants:  cyclobenzaprine 195mTID Current Antihypertensive medications:  atenolol 2573maily, lsoartan 26m23mily Current Antidepressant medications:  none Current Anticonvulsant medications:  none Current anti-CGRP:  none Current Vitamins/Herbal/Supplements:  none Current Antihistamines/Decongestants:  meclizine Other therapy:  *** Birth control:  none   Caffeine:  *** Alcohol:  *** Smoker:  *** Diet:  *** Exercise:  *** Depression:  ***; Anxiety:  *** Other pain:  *** Sleep hygiene:  *** Family history of headache:  ***      PAST MEDICAL HISTORY: Past Medical History:  Diagnosis Date   Anemia    Anxiety    Chest pain    a. Felt noncardiac with normal stress echo 11/2012.   Cholelithiasis    a. on Abd [id number].   Complication of anesthesia    Depression    GERD (gastroesophageal reflux disease)    Hypertension    Migraines    PONV (postoperative nausea and vomiting)    Radiculopathy of cervical region    [facility]   TIA    PAST SURGICAL HISTORY: Past Surgical History:  Procedure Laterality Date   CHOLECYSTECTOMY N/A 08/07/2013   Procedure: LAPAROSCOPIC CHOLECYSTECTOMY;  Surgeon: PaulMerrie Roof;  Location: [facility]  Service: General;  Laterality: N/A;   CLEFT LIP REPAIR     Multiple   CLEFT PALATE REPAIR     Multiple   ROTATOR CUFF REPAIR  2010   left   TUBAL LIGATION  1998  MEDICATIONS: Current Outpatient Medications on File Prior to Visit  Medication Sig Dispense Refill   ALPRAZolam (XANAX) 0.5 MG tablet Take 0.5 mg by mouth. One tablet once to twice daily  0   atenolol (TENORMIN) 25 MG tablet Take by mouth daily.     cyclobenzaprine (FLEXERIL) 10 MG tablet Take 1 tablet by mouth 2 (two) times daily.  0   losartan  (COZAAR) 25 MG tablet Take 25 mg by mouth daily.     Vitamin D, Ergocalciferol, (DRISDOL) 50000 units CAPS capsule Take 1 capsule by mouth once a week. (Patient not taking: Reported on 11/27/2022)  3   No current facility-administered medications on file prior to visit.    ALLERGIES: Allergies  Allergen Reactions   Other Nausea And Vomiting    "sleeping gas"= makes me vomit  (even when a sleep)   Latex Hives, Itching and Rash   Amoxicillin Nausea Only   Contrast Media [Iodinated Contrast Media]    Iohexol      Desc: Pt had throat tightness, slight swelling. Itchy eyes and itchy gums.  Dr Tery Sanfilippo spoke w/ pt.  Treating this like possible/early reaction.  Pt was counseled. Needs full premeds w/ IV contrast, txs., Onset Date: [id number]    Doxycycline Rash   Erythromycin Rash    FAMILY HISTORY: Family History  Problem Relation Age of Onset   Migraines Mother    Heart disease Father    Alcohol abuse Father    Heart attack Father    Breast cancer Maternal Aunt    Depression Maternal Aunt    Heart disease Paternal Uncle    Diabetes Paternal Uncle    Stroke Paternal Uncle    Ovarian cancer Maternal Grandmother    Heart disease Maternal Grandmother    Stroke Maternal Grandmother    Depression Maternal Grandmother    Heart attack Maternal Grandmother    Hypertension Maternal Grandmother    Heart disease Maternal Grandfather    Heart attack Maternal Grandfather    Heart disease Paternal Grandmother    Heart attack Paternal Grandmother    Diabetes Paternal Grandfather    Stroke Paternal Grandfather     Objective:  *** General: No acute distress.  Patient appears well-groomed.   Head:  Normocephalic/atraumatic Eyes:  fundi examined but not visualized Neck: supple, no paraspinal tenderness, full range of motion Back: No paraspinal tenderness Heart: regular rate and rhythm Lungs: Clear to auscultation bilaterally. Vascular: No carotid bruits. Neurological Exam: Mental status:  alert and oriented to person, place, and time, speech fluent and not dysarthric, language intact. Cranial nerves: CN I: not tested CN II: pupils equal, round and reactive to light, visual fields intact CN III, IV, VI:  full range of motion, no nystagmus, no ptosis CN V: facial sensation intact. CN VII: upper and lower face symmetric CN VIII: hearing intact CN IX, X: gag intact, uvula midline CN XI: sternocleidomastoid and trapezius muscles intact CN XII: tongue midline Bulk & Tone: normal, no fasciculations. Motor:  muscle strength 5/5 throughout Sensation:  Pinprick, temperature and vibratory sensation intact. Deep Tendon Reflexes:  2+ throughout,  toes downgoing.   Finger to nose testing:  Without dysmetria.   Heel to shin:  Without dysmetria.   Gait:  Normal station and stride.  Romberg negative.    Thank you for allowing me to take part in the care of this patient.  Metta Clines, DO  CC: ***

## 2022-12-26 ENCOUNTER — Ambulatory Visit (INDEPENDENT_AMBULATORY_CARE_PROVIDER_SITE_OTHER): Payer: BC Managed Care – PPO | Admitting: Neurology

## 2022-12-26 ENCOUNTER — Encounter: Payer: Self-pay | Admitting: Neurology

## 2022-12-26 VITALS — BP 97/54 | HR 78 | Ht 61.0 in | Wt 143.8 lb

## 2022-12-26 DIAGNOSIS — G43111 Migraine with aura, intractable, with status migrainosus: Secondary | ICD-10-CM

## 2022-12-26 MED ORDER — EMGALITY 120 MG/ML ~~LOC~~ SOAJ: 240.0000 mg | Freq: Once | SUBCUTANEOUS | 0 refills | Status: AC

## 2022-12-26 MED ORDER — EMGALITY 120 MG/ML ~~LOC~~ SOAJ: 120.0000 mg | SUBCUTANEOUS | 11 refills | Status: DC

## 2022-12-26 NOTE — Patient Instructions (Signed)
Check MRI of brain with and without contrast Start Emgality - 2 injections for first dose, then 1 injection every 28 days When you get a migraine, take Nurtec (1 tablet daily as needed).  If effective, contact me for prescription. Follow up 4-5 months

## 2022-12-27 ENCOUNTER — Telehealth: Payer: Self-pay | Admitting: Anesthesiology

## 2022-12-27 NOTE — Telephone Encounter (Signed)
Pt called stating her insurance is not approving the starter pack of the Emgality. She is only able to get the first month. Does she need the starter pack?? Pt requests call back. [phone] or [phone].

## 2022-12-28 NOTE — Telephone Encounter (Signed)
Per Dr.Jaffe, She needs to starter pack.  This is a new medication.

## 2023-01-01 NOTE — Telephone Encounter (Signed)
LMOVM for patient, Yes she do need the starter kit. No we do not have samples right now.    Dr,jaffe, per last note Nurtec for Rescue. May need script sent in.

## 2023-01-01 NOTE — Telephone Encounter (Signed)
Patient is calling in asking for an update. States she spoke with someone about how insurance would not pay for the starter kit and how the nurse was going to check with Dr.Jaffe about what steps need to happen. Also was requesting a follow up on if Dr.Jaffe would prescribe medication for her onset migraines.

## 2023-01-02 ENCOUNTER — Other Ambulatory Visit: Payer: Self-pay | Admitting: Neurology

## 2023-01-02 MED ORDER — NURTEC 75 MG PO TBDP: 75.0000 mg | ORAL_TABLET | Freq: Every day | ORAL | 11 refills | Status: DC | PRN

## 2023-01-04 NOTE — Telephone Encounter (Signed)
LMOVM for patient,  Script for [employer] sent to [facility] on [address].  Make sure she has a copay card to activate if needed.  Also spoke to the Rep for Emgality the Copay card should allow her to pick up the two loading dose pens.

## 2023-01-05 NOTE — Telephone Encounter (Signed)
Pt called in stating they approved the 28 days of the Emgality but not the starter pack. She is wondering what to do at this point and what the status is of the Nurtec?

## 2023-01-08 ENCOUNTER — Encounter: Payer: Self-pay | Admitting: Physical Therapy

## 2023-01-08 ENCOUNTER — Ambulatory Visit: Payer: BC Managed Care – PPO | Attending: Obstetrics and Gynecology | Admitting: Physical Therapy

## 2023-01-08 DIAGNOSIS — R252 Cramp and spasm: Secondary | ICD-10-CM | POA: Diagnosis present

## 2023-01-08 DIAGNOSIS — R102 Pelvic and perineal pain: Secondary | ICD-10-CM | POA: Diagnosis present

## 2023-01-08 DIAGNOSIS — M5459 Other low back pain: Secondary | ICD-10-CM | POA: Diagnosis present

## 2023-01-08 DIAGNOSIS — M6281 Muscle weakness (generalized): Secondary | ICD-10-CM | POA: Diagnosis present
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
## 2023-01-08 NOTE — Telephone Encounter (Signed)
LMOVm for patient and sent a Mychart message.

## 2023-01-10 ENCOUNTER — Ambulatory Visit: Payer: BC Managed Care – PPO | Admitting: Physical Therapy

## 2023-01-10 ENCOUNTER — Telehealth: Payer: Self-pay

## 2023-01-10 ENCOUNTER — Encounter: Payer: Self-pay | Admitting: Physical Therapy

## 2023-01-10 DIAGNOSIS — R102 Pelvic and perineal pain: Secondary | ICD-10-CM

## 2023-01-10 DIAGNOSIS — M5459 Other low back pain: Secondary | ICD-10-CM

## 2023-01-10 DIAGNOSIS — M6281 Muscle weakness (generalized): Secondary | ICD-10-CM | POA: Diagnosis not present

## 2023-01-10 DIAGNOSIS — R252 Cramp and spasm: Secondary | ICD-10-CM

## 2023-01-10 NOTE — Patient Instructions (Signed)
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
## 2023-01-10 NOTE — Telephone Encounter (Signed)
Please update on PA Auth, Also per patient she will need a PA for Nurtec.

## 2023-01-11 NOTE — Telephone Encounter (Signed)
Can we start Ajovy instead and let her insurance know that she is unable to start Emgality (it is no longer an option right now)?  If she would like to give it a try, please send prescription for Ajovy every 28 days, refills 11.   Or try to wait on the copay card hopefully by tomorrow it will be working.  Or third we can send it to the [employer] and see if that will work. It  is a Development worker, community in pharmacy. So she will have to wait until it is mailed to the patient.

## 2023-01-12 ENCOUNTER — Telehealth: Payer: Self-pay | Admitting: Pharmacy Technician

## 2023-01-12 ENCOUNTER — Other Ambulatory Visit (HOSPITAL_COMMUNITY): Payer: Self-pay

## 2023-01-12 NOTE — Telephone Encounter (Signed)
Patient Advocate Encounter  Received notification from [facility] that prior authorization for EMGALITY (LOADING) is required.   PA submitted on 3.1.24 Key [id number] Status is pending

## 2023-01-12 NOTE — Telephone Encounter (Signed)
Patient Advocate Encounter  Received notification from [employer] that prior authorization for NURTEC is required.   PA submitted on 3.1.24 Key [id number] Status is pending

## 2023-01-15 ENCOUNTER — Ambulatory Visit: Payer: BC Managed Care – PPO | Attending: Obstetrics and Gynecology | Admitting: Physical Therapy

## 2023-01-15 ENCOUNTER — Encounter: Payer: Self-pay | Admitting: Physical Therapy

## 2023-01-15 DIAGNOSIS — M6281 Muscle weakness (generalized): Secondary | ICD-10-CM | POA: Insufficient documentation

## 2023-01-15 DIAGNOSIS — R102 Pelvic and perineal pain: Secondary | ICD-10-CM | POA: Diagnosis present

## 2023-01-15 DIAGNOSIS — M5459 Other low back pain: Secondary | ICD-10-CM

## 2023-01-15 DIAGNOSIS — R252 Cramp and spasm: Secondary | ICD-10-CM | POA: Insufficient documentation
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
## 2023-01-16 NOTE — Telephone Encounter (Signed)
Received notification from [facility] regarding a prior authorization for  [employer] . Authorization has been APPROVED through 04/06/2023  Authorization # Key: Fanny Dance

## 2023-01-17 ENCOUNTER — Encounter: Payer: Self-pay | Admitting: Physical Therapy

## 2023-01-17 ENCOUNTER — Ambulatory Visit: Payer: BC Managed Care – PPO | Admitting: Physical Therapy

## 2023-01-17 DIAGNOSIS — M6281 Muscle weakness (generalized): Secondary | ICD-10-CM

## 2023-01-17 DIAGNOSIS — R252 Cramp and spasm: Secondary | ICD-10-CM

## 2023-01-17 DIAGNOSIS — M5459 Other low back pain: Secondary | ICD-10-CM

## 2023-01-17 DIAGNOSIS — R102 Pelvic and perineal pain: Secondary | ICD-10-CM
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
## 2023-01-18 NOTE — Telephone Encounter (Signed)
LMOVM for approval.

## 2023-01-18 NOTE — Telephone Encounter (Signed)
PA Approved

## 2023-01-19 ENCOUNTER — Telehealth: Payer: Self-pay

## 2023-01-19 NOTE — Telephone Encounter (Signed)
ERROR

## 2023-01-19 NOTE — Telephone Encounter (Signed)
Patient is calling in stating that pharmacy states they do not have any approval for the PA. Patient is asking what she needs to do.

## 2023-01-19 NOTE — Telephone Encounter (Signed)
Spoke to patient, Reference number for both approval. Please call and to see what is wrong.    Also on our end I will reach out to the Rep for [employer] to advised of this.

## 2023-01-21 ENCOUNTER — Ambulatory Visit
Admission: RE | Admit: 2023-01-21 | Discharge: 2023-01-21 | Disposition: A | Discharge status: Home / Self Care | Payer: BC Managed Care – PPO | Source: Ambulatory Visit | Attending: Neurology | Admitting: Neurology

## 2023-01-21 DIAGNOSIS — G43111 Migraine with aura, intractable, with status migrainosus: Secondary | ICD-10-CM

## 2023-01-21 MED ORDER — GADOPICLENOL 0.5 MMOL/ML IV SOLN
6.0000 mL | Freq: Once | INTRAVENOUS | Status: AC | PRN
  Administered 2023-01-21: 6 mL via INTRAVENOUS

## 2023-01-22 ENCOUNTER — Ambulatory Visit: Payer: BC Managed Care – PPO | Admitting: Physical Therapy

## 2023-01-22 ENCOUNTER — Encounter: Payer: Self-pay | Admitting: Physical Therapy

## 2023-01-22 DIAGNOSIS — R252 Cramp and spasm: Secondary | ICD-10-CM

## 2023-01-22 DIAGNOSIS — R102 Pelvic and perineal pain: Secondary | ICD-10-CM

## 2023-01-22 DIAGNOSIS — M5459 Other low back pain: Secondary | ICD-10-CM

## 2023-01-22 DIAGNOSIS — M6281 Muscle weakness (generalized): Secondary | ICD-10-CM | POA: Diagnosis not present
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
## 2023-01-24 ENCOUNTER — Ambulatory Visit: Payer: BC Managed Care – PPO | Admitting: Physical Therapy

## 2023-01-29 ENCOUNTER — Encounter: Payer: BC Managed Care – PPO | Admitting: Physical Therapy

## 2023-01-31 ENCOUNTER — Encounter: Payer: BC Managed Care – PPO | Admitting: Physical Therapy

## 2023-02-05 ENCOUNTER — Ambulatory Visit: Payer: BC Managed Care – PPO | Admitting: Physical Therapy

## 2023-02-07 ENCOUNTER — Encounter: Payer: BC Managed Care – PPO | Admitting: Physical Therapy

## 2023-02-12 ENCOUNTER — Encounter: Payer: BC Managed Care – PPO | Admitting: Physical Therapy

## 2023-02-14 ENCOUNTER — Encounter: Payer: BC Managed Care – PPO | Admitting: Physical Therapy

## 2023-02-19 ENCOUNTER — Encounter: Payer: BC Managed Care – PPO | Admitting: Physical Therapy

## 2023-02-21 ENCOUNTER — Encounter: Payer: BC Managed Care – PPO | Admitting: Physical Therapy

## 2023-02-26 ENCOUNTER — Encounter: Payer: BC Managed Care – PPO | Admitting: Physical Therapy

## 2023-02-28 ENCOUNTER — Encounter: Payer: BC Managed Care – PPO | Admitting: Physical Therapy

## 2023-03-02 ENCOUNTER — Encounter: Payer: Self-pay | Admitting: Physician Assistant

## 2023-03-02 ENCOUNTER — Ambulatory Visit: Payer: BC Managed Care – PPO | Admitting: Physician Assistant

## 2023-03-02 DIAGNOSIS — M542 Cervicalgia: Secondary | ICD-10-CM | POA: Diagnosis not present

## 2023-03-02 DIAGNOSIS — G8929 Other chronic pain: Secondary | ICD-10-CM | POA: Diagnosis not present

## 2023-03-02 DIAGNOSIS — G5603 Carpal tunnel syndrome, bilateral upper limbs: Secondary | ICD-10-CM

## 2023-03-02 NOTE — Progress Notes (Signed)
Office Visit Note   Patient: [name]           Date of Birth: 1971-03-06           MRN: [id number] Visit Date: 03/02/2023              Requested by: Rebecca Mage, NP [address] [place],  [place] [address] PCP: Rebecca Mage, NP   Assessment & Plan: Visit Diagnoses:  1. Bilateral carpal tunnel syndrome   2. Chronic neck pain     Plan: [name] is a pleasant 52 year old woman who comes in today with a history of bilateral hand numbness times several years.  Denies any injury has done a lot of work with her hands.  She did have electrodiagnostic studies done in [facility] which she did not bring with her and were not available to [place].  She said they were positive for carpal tunnel syndrome.  She also has stiffness in her neck and has chronic neck pain and fibromyalgia.  Her cervical films have shown degenerative changes.  Her last MRI of her cervical spine was in 2017.  This showed degenerative changes and early stenosis.  She does have a lot of stiffness and some radicular findings on her neck today.  She does have a positive Phalen sign negative Tinel's sign she is not diabetic and she reports being ambidextrous.  Certainly could be both coming from her cervical spine and her hands.  I would like for her to obtain the studies that she had and follow-up with Dr. Roda Shutters for discussion of possible carpal tunnel release.  She request that they both be done at the same time I told that I do not think that is possible because of the immobilization. Follow-Up Instructions: Return With electrode diagnostic studies with Dr. Roda Shutters.   Orders:  No orders of the defined types were placed in this encounter.  No orders of the defined types were placed in this encounter.     Procedures: No procedures performed   Clinical Data: No additional findings.   Subjective: Chief Complaint  Patient presents with   Right Hand - Pain   Left Hand - Pain    HPI pleasant 52 year old woman  who comes in today complaining of bilateral carpal tunnel syndrome.  She is ambidextrous and denies any injuries.  She said she has been having numbness in her hands for years.  She did have recent studies done at [facility] which per her report showed carpal tunnel syndrome worse on the right than left.  She also has some radicular findings in her arms and a history of cervical neck issues she is here primarily to discuss carpal tunnel treatment  Review of Systems  All other systems reviewed and are negative.    Objective: Vital Signs: There were no vitals taken for this visit.  Physical Exam Constitutional:      Appearance: Normal appearance.  Pulmonary:     Effort: Pulmonary effort is normal.  Skin:    General: Skin is warm and dry.  Neurological:     General: No focal deficit present.     Mental Status: She is alert and oriented to person, place, and time.     Ortho Exam Examination bilateral hands she has some slight atrophy of the right thenar.  She has slight decreased grip strength.  She subjectively claims she has paresthesias mostly in the thumb index and long digit.  She has a negative Tinel's  sign she has a positive Phalen's test no tenderness to palpation over elbow she has no erythema no swelling in her hand she has strong radial pulses. Examination of her neck neck pain is very limited in motion with flexion extension and side-to-side turning.  She does feel like she does get some shooting pain down her arms when she does this but does not seem to recreate the numbness in her hands Specialty Comments:  No specialty comments available.  Imaging: No results found.   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 Felt noncardiac with normal stress echo 11/2012.   Cholelithiasis    a. on Abd [place] 11/2012.   Complication of anesthesia    Depression    GERD (gastroesophageal reflux disease)    Hypertension    Migraines    PONV (postoperative nausea and vomiting)    Radiculopathy of cervical region    Stroke 11/13/2005   TIA    Family History  Problem Relation Age of Onset   Migraines Mother    Heart disease Father    Alcohol abuse Father    Heart attack Father    Migraines Maternal Aunt    Breast cancer Maternal Aunt    Depression Maternal Aunt    Migraines Maternal Aunt    Heart disease Paternal Uncle    Diabetes Paternal Uncle    Stroke Paternal Uncle    Ovarian cancer Maternal Grandmother    Heart disease Maternal Grandmother    Stroke Maternal Grandmother    Depression Maternal Grandmother    Heart attack Maternal Grandmother    Hypertension Maternal Grandmother    Heart disease Maternal Grandfather    Heart attack Maternal Grandfather    Heart disease Paternal Grandmother    Heart attack Paternal Grandmother    Diabetes Paternal Grandfather    Stroke Paternal Grandfather    Migraines Son    Migraines Son     Past Surgical History:  Procedure Laterality Date   CHOLECYSTECTOMY N/A 08/07/2013   Procedure: LAPAROSCOPIC CHOLECYSTECTOMY;  Surgeon: Caleen Essex III, MD;  Location: [facility];  Service: General;  Laterality: N/A;   CLEFT LIP REPAIR     Multiple   CLEFT PALATE REPAIR     Multiple   ROTATOR CUFF REPAIR  2010   left   TUBAL LIGATION  1998   Social History   Occupational History   Occupation: Glass blower/designer at [employer]  Tobacco Use   Smoking status: Former    Packs/day: 0.10    Years:  1.00    Additional pack years: 0.00    Total pack years: 0.10    Types: Cigarettes    Start date: 04/10/2012    Quit date: 08/07/2013    Years since quitting: 9.5   Smokeless tobacco: Never  Vaping Use   Vaping Use: Never used  Substance and Sexual Activity   Alcohol use: Yes    Comment: 1-2 a year  Drug use: No   Sexual activity: Yes

## 2023-03-05 ENCOUNTER — Encounter: Payer: BC Managed Care – PPO | Admitting: Physical Therapy

## 2023-03-07 ENCOUNTER — Encounter: Payer: BC Managed Care – PPO | Admitting: Physical Therapy

## 2023-03-09 ENCOUNTER — Encounter: Payer: Self-pay | Admitting: Orthopaedic Surgery

## 2023-03-09 ENCOUNTER — Ambulatory Visit: Payer: BC Managed Care – PPO | Admitting: Orthopaedic Surgery

## 2023-03-09 DIAGNOSIS — G5601 Carpal tunnel syndrome, right upper limb: Secondary | ICD-10-CM

## 2023-03-09 DIAGNOSIS — G5603 Carpal tunnel syndrome, bilateral upper limbs: Secondary | ICD-10-CM

## 2023-03-09 DIAGNOSIS — G5602 Carpal tunnel syndrome, left upper limb: Secondary | ICD-10-CM | POA: Diagnosis not present

## 2023-03-09 MED ORDER — BUPIVACAINE HCL 0.5 % IJ SOLN
1.0000 mL | INTRAMUSCULAR | Status: AC | PRN
  Administered 2023-03-09: 1 mL

## 2023-03-09 MED ORDER — METHYLPREDNISOLONE ACETATE 40 MG/ML IJ SUSP
40.0000 mg | INTRAMUSCULAR | Status: AC | PRN
Start: 2023-03-09 — End: 2023-03-09
  Administered 2023-03-09: 40 mg

## 2023-03-09 MED ORDER — LIDOCAINE HCL 1 % IJ SOLN
1.0000 mL | INTRAMUSCULAR | Status: AC | PRN
  Administered 2023-03-09: 1 mL

## 2023-03-09 MED ORDER — LIDOCAINE HCL 1 % IJ SOLN
1.0000 mL | INTRAMUSCULAR | Status: AC | PRN
Start: 2023-03-09 — End: 2023-03-09
  Administered 2023-03-09: 1 mL

## 2023-03-09 NOTE — Progress Notes (Signed)
Office Visit Note   Patient: [name]           Date of Birth: 02-02-1971           MRN: [id number] Visit Date: 03/09/2023              Requested by: Lars Mage, NP [address] [place],  [place] [address] PCP: Lars Mage, NP   Assessment & Plan: Visit Diagnoses:  1. Bilateral carpal tunnel syndrome     Plan: Impression is bilateral carpal tunnel syndrome.  Nerve conduction studies reviewed which are consistent with carpal tunnel syndrome.  Treatment options were discussed and we agreed to try bilateral carpal tunnel injections.  She will follow-up with these injections are ineffective or when the relief wears off.  Follow-Up Instructions: No follow-ups on file.   Orders:  No orders of the defined types were placed in this encounter.  No orders of the defined types were placed in this encounter.     Procedures: Hand/UE Inj: bilateral carpal tunnel for carpal tunnel syndrome on 03/09/2023 9:25 AM Indications: pain Details: 25 G needle Medications (Right): 1 mL lidocaine 1 %; 1 mL bupivacaine 0.5 %; 40 mg methylPREDNISolone acetate 40 MG/ML Medications (Left): 1 mL lidocaine 1 %; 1 mL bupivacaine 0.5 %; 40 mg methylPREDNISolone acetate 40 MG/ML Outcome: tolerated well, no immediate complications Patient was prepped and draped in the usual sterile fashion.       Clinical Data: No additional findings.   Subjective: No chief complaint on file.   HPI  [name] is a 52 year old female here for bilateral hand pain and numbness.  Referral from Vinc[facility] Hews.  Had nerve studies done last month which showed median nerve compression.  She wears braces at night and copper gloves during the day.  Feels that all 10 fingertips are numb.  Works at [employer].  Review of Systems  Constitutional: Negative.   HENT: Negative.    Eyes: Negative.   Respiratory: Negative.    Cardiovascular: Negative.   Endocrine: Negative.   Musculoskeletal: Negative.   Neurological:  Negative.   Hematological: Negative.   Psychiatric/Behavioral: Negative.    All other systems reviewed and are negative.    Objective: Vital Signs: There were no vitals taken for this visit.  Physical Exam Vitals and nursing note reviewed.  Constitutional:      Appearance: She is well-developed.  HENT:     Head: Normocephalic and atraumatic.  Pulmonary:     Effort: Pulmonary effort is normal.  Abdominal:     Palpations: Abdomen is soft.  Musculoskeletal:     Cervical back: Neck supple.  Skin:    General: Skin is warm.     Capillary Refill: Capillary refill takes less than 2 seconds.  Neurological:     Mental Status: She is alert and oriented to person, place, and time.  Psychiatric:        Behavior: Behavior normal.        Thought Content: Thought content normal.        Judgment: Judgment normal.     Ortho Exam  Examination bilateral hands show positive carpal tunnel compressive signs.  Questionable atrophy to the first dorsal interosseous and thenar compartment.  No neurovascular compromise.  Specialty Comments:  No specialty comments available.  Imaging: No results found.   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 Felt noncardiac with normal stress echo 11/2012.   Cholelithiasis    a. on Abd [place] 11/2012.   Complication of anesthesia    Depression    GERD (gastroesophageal reflux disease)    Hypertension    Migraines    PONV  (postoperative nausea and vomiting)    Radiculopathy of cervical region    Stroke [facility]) 11/13/2005   TIA    Family History  Problem Relation Age of Onset   Migraines Mother    Heart disease Father    Alcohol abuse Father    Heart attack Father    Migraines Maternal Aunt    Breast cancer Maternal Aunt    Depression Maternal Aunt    Migraines Maternal Aunt    Heart disease Paternal Uncle    Diabetes Paternal Uncle    Stroke Paternal Uncle    Ovarian cancer Maternal Grandmother    Heart disease Maternal Grandmother    Stroke Maternal Grandmother    Depression Maternal Grandmother    Heart attack Maternal Grandmother    Hypertension Maternal Grandmother    Heart disease Maternal Grandfather    Heart attack Maternal Grandfather    Heart disease Paternal Grandmother    Heart attack Paternal Grandmother    Diabetes Paternal Grandfather    Stroke Paternal Grandfather    Migraines Son    Migraines Son     Past Surgical History:  Procedure Laterality Date   CHOLECYSTECTOMY N/A 08/07/2013   Procedure: LAPAROSCOPIC CHOLECYSTECTOMY;  Surgeon: Caleen Essex III, MD;  Location: [facility] OR;  Service: General;  Laterality: N/A;   CLEFT LIP REPAIR     Multiple   CLEFT PALATE REPAIR     Multiple   ROTATOR CUFF REPAIR  2010   left   TUBAL LIGATION  1998   Social History   Occupational History   Occupation: Glass blower/designer at [employer]  Tobacco Use   Smoking status: Former    Packs/day: 0.10    Years: 1.00    Additional pack years: 0.00    Total pack years: 0.10    Types: Cigarettes    Start date: 04/10/2012    Quit date: 08/07/2013    Years since quitting: 9.5   Smokeless tobacco: Never  Vaping Use   Vaping Use: Never used  Substance and Sexual Activity   Alcohol use: Yes    Comment: 1-2 a year   Drug use: No   Sexual activity: Yes

## 2023-03-12 ENCOUNTER — Telehealth: Payer: Self-pay | Admitting: Orthopaedic Surgery

## 2023-03-12 NOTE — Telephone Encounter (Signed)
Yes definitely see neurology first.  Thanks.

## 2023-03-12 NOTE — Telephone Encounter (Signed)
Patient called in stating the Bil injections did not work and she would like to know should she see her neurologist before or after she comes back to see Roda Shutters please advise he wasnted her to keep them in the loop and she knows they talked about surgery.

## 2023-03-13 NOTE — Telephone Encounter (Signed)
Tried to call twice. Rings once and then silence. Will try again later.

## 2023-03-14 NOTE — Telephone Encounter (Signed)
Called and LMOM 

## 2023-04-10 NOTE — Progress Notes (Unsigned)
NEUROLOGY FOLLOW UP OFFICE NOTE  TARESSA KILIAN [id number]  Assessment/Plan:   Migraine with aura, with status migrainosus, intractable Dizziness - hypotension   As she is having worsening migraines with new focal symptoms, check MRI of brain with and without contrast Migraine prevention:  Start Emgality Migraine rescue:  Nurtec Limit use of pain relievers to no more than 2 days out of week to prevent risk of rebound or medication-overuse headache. Keep headache diary Follow up with PCP regarding low blood pressure Follow up with me in 4 to 5 months.       Subjective:  Elaf S Whitmoyer is a 52 year old female with HTN, anxiety, and history of psychogenic movements whom I see for migraines presents today for bilateral hand numbness.    She has history of chronic neck pain with radicular symptoms as well as fibromyalgia.    10/13/2016 MRI C-SPINE WO:  1. No evidence of myelopathy.  2. Upper cervical facet arthropathy and mid cervical disc degeneration as described.  3. 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 C5-6 moderate left foraminal narrowing from uncovertebral spur.  4. 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   ***.  She reportedly had NCV-EMG *** which revealed bilateral carpal tunnel syndrome.  She saw orthopedics who performed steroid injections into the carpal tunnels which did not work.  ***  PAST MEDICAL HISTORY: Past Medical History:  Diagnosis Date   Anemia    Anxiety    Chest pain    a. Felt noncardiac with normal stress echo 11/2012.   Cholelithiasis    a. on Abd [place] 11/2012.   Complication of anesthesia    Depression    GERD (gastroesophageal reflux disease)    Hypertension    Migraines    PONV (postoperative nausea and vomiting)    Radiculopathy of cervical region    Stroke [facility]) 11/13/2005   TIA    MEDICATIONS: Current Outpatient Medications on File Prior to Visit  Medication Sig Dispense Refill   atenolol (TENORMIN) 25 MG tablet Take by mouth  daily.     cyclobenzaprine (FLEXERIL) 10 MG tablet Take 1 tablet by mouth 2 (two) times daily.  0   Galcanezumab-gnlm (EMGALITY) 120 MG/ML SOAJ Inject 120 mg into the skin every 28 (twenty-eight) days. 1.12 mL 11   losartan (COZAAR) 25 MG tablet Take 25 mg by mouth daily.     nystatin cream (MYCOSTATIN) Apply 1 Application topically 2 (two) times daily.     Rimegepant Sulfate (NURTEC) 75 MG TBDP Take 1 tablet (75 mg total) by mouth daily as needed. 8 tablet 11   No current facility-administered medications on file prior to visit.    ALLERGIES: Allergies  Allergen Reactions   Other Nausea And Vomiting    "sleeping gas"= makes me vomit  (even when a sleep)   Latex Hives, Itching and Rash   Amoxicillin Nausea Only   Contrast Media [Iodinated Contrast Media]     CT   Iohexol      Desc: Pt had throat tightness, slight swelling. Itchy eyes and itchy gums.  Dr Molli Posey spoke w/ pt.  Treating this like possible/early reaction.  Pt was counseled. Needs full premeds w/ IV contrast, txs., Onset Date: [id number]    Doxycycline Rash   Erythromycin Rash    FAMILY HISTORY: Family History  Problem Relation Age of Onset   Migraines Mother    Heart disease Father    Alcohol abuse Father    Heart attack Father  Migraines Maternal Aunt    Breast cancer Maternal Aunt    Depression Maternal Aunt    Migraines Maternal Aunt    Heart disease Paternal Uncle    Diabetes Paternal Uncle    Stroke Paternal Uncle    Ovarian cancer Maternal Grandmother    Heart disease Maternal Grandmother    Stroke Maternal Grandmother    Depression Maternal Grandmother    Heart attack Maternal Grandmother    Hypertension Maternal Grandmother    Heart disease Maternal Grandfather    Heart attack Maternal Grandfather    Heart disease Paternal Grandmother    Heart attack Paternal Grandmother    Diabetes Paternal Grandfather    Stroke Paternal Grandfather    Migraines Son    Migraines Son       Objective:   *** General: No acute distress.  Patient appears ***-groomed.   Head:  Normocephalic/atraumatic Eyes:  Fundi examined but not visualized Neck: supple, no paraspinal tenderness, full range of motion Heart:  Regular rate and rhythm Lungs:  Clear to auscultation bilaterally Back: No paraspinal tenderness Neurological Exam: alert and oriented.  Speech fluent and not dysarthric, language intact.  CN II-XII intact. Bulk and tone normal, muscle strength 5/5 throughout.  Sensation to light touch intact.  Deep tendon reflexes 2+ throughout, toes downgoing.  Finger to nose testing intact.  Gait normal, Romberg negative.   Shon Millet, DO  CC: ***

## 2023-04-11 ENCOUNTER — Encounter: Payer: Self-pay | Admitting: Neurology

## 2023-04-11 ENCOUNTER — Telehealth: Payer: Self-pay

## 2023-04-11 ENCOUNTER — Other Ambulatory Visit (INDEPENDENT_AMBULATORY_CARE_PROVIDER_SITE_OTHER): Payer: BC Managed Care – PPO

## 2023-04-11 ENCOUNTER — Ambulatory Visit: Payer: BC Managed Care – PPO | Admitting: Neurology

## 2023-04-11 VITALS — BP 119/68 | HR 85 | Ht 61.0 in | Wt 145.2 lb

## 2023-04-11 DIAGNOSIS — M4802 Spinal stenosis, cervical region: Secondary | ICD-10-CM | POA: Diagnosis not present

## 2023-04-11 DIAGNOSIS — R202 Paresthesia of skin: Secondary | ICD-10-CM | POA: Diagnosis not present

## 2023-04-11 DIAGNOSIS — R200 Anesthesia of skin: Secondary | ICD-10-CM | POA: Diagnosis not present

## 2023-04-11 DIAGNOSIS — R29898 Other symptoms and signs involving the musculoskeletal system: Secondary | ICD-10-CM

## 2023-04-11 LAB — VITAMIN B12: Vitamin B-12: 345 pg/mL (ref 211–911)

## 2023-04-11 LAB — TSH: TSH: 1.54 u[IU]/mL (ref 0.35–5.50)

## 2023-04-11 NOTE — Patient Instructions (Addendum)
Check MRI of cervical spine Check EMG of legs Check B12, TSH, IFE, ANA and ENA panel Further recommendations pending results. Otherwise, follow up 6 months.

## 2023-04-11 NOTE — Telephone Encounter (Signed)
PA Needed for Emgality.

## 2023-04-11 NOTE — Addendum Note (Signed)
Addended by: Leida Lauth on: 04/11/2023 11:25 AM   Modules accepted: Orders

## 2023-04-12 ENCOUNTER — Telehealth: Payer: Self-pay | Admitting: Pharmacy Technician

## 2023-04-12 LAB — IMMUNOFIXATION, SERUM

## 2023-04-12 NOTE — Telephone Encounter (Signed)
Patient Advocate Encounter  Received notification from [facility] that prior authorization for [facility] 120MG  is required.   PA submitted on 5.30.24 Key [id number] Status is pending

## 2023-04-12 NOTE — Telephone Encounter (Signed)
PA has been submitted EXPEDITED, and telephone encounter has been created.  

## 2023-04-13 ENCOUNTER — Other Ambulatory Visit (HOSPITAL_COMMUNITY): Payer: Self-pay

## 2023-04-13 ENCOUNTER — Telehealth: Payer: Self-pay | Admitting: Pharmacy Technician

## 2023-04-13 NOTE — Telephone Encounter (Signed)
Patient Advocate Encounter  Received notification from [facility] that prior authorization for NURTEC 75MG  is required.   PA submitted on 5.31.24 Key [id number] Status is pending

## 2023-04-18 NOTE — Telephone Encounter (Signed)
Patient Advocate Encounter  Prior Authorization for Emgality 120MG /ML auto-injectors (migraine) has been approved through [employer].    Key: [id number]  Effective: 04-13-2023 to 04-12-2024

## 2023-04-20 LAB — IMMUNOFIXATION, SERUM
IgA/Immunoglobulin A, Serum: 96 mg/dL (ref 87–352)
IgG (Immunoglobin G), Serum: 791 mg/dL (ref 586–1602)
IgM (Immunoglobulin M), Srm: 116 mg/dL (ref 26–217)

## 2023-04-20 NOTE — Telephone Encounter (Signed)
Patient Advocate Encounter  Received a fax from [facility] [employer] regarding Prior Authorization for Nurtec 75MG  dispersible tablets.   Key: [id number]  Authorization has been DENIED due to    Determination letter attached to patient chart

## 2023-04-23 ENCOUNTER — Telehealth: Payer: Self-pay | Admitting: Pharmacy Technician

## 2023-04-23 ENCOUNTER — Other Ambulatory Visit (HOSPITAL_COMMUNITY): Payer: Self-pay

## 2023-04-23 NOTE — Telephone Encounter (Signed)
Patient Advocate Encounter  Received notification from [facility] that prior authorization for NURTEC 75MG  is required.   PA submitted on 6.10.24 Key [id number]  Status is pending

## 2023-04-23 NOTE — Progress Notes (Signed)
Tried calling patient no answer.

## 2023-04-25 ENCOUNTER — Telehealth: Payer: Self-pay | Admitting: Neurology

## 2023-04-25 NOTE — Telephone Encounter (Signed)
Pt states that she got a letter from [employer] stating that they would nto cover the nurtec    [place] [address]

## 2023-04-30 ENCOUNTER — Telehealth: Payer: Self-pay

## 2023-04-30 NOTE — Telephone Encounter (Signed)
Per patient the nurtec works. She would like to stay on it.

## 2023-04-30 NOTE — Telephone Encounter (Signed)
How frequent or the headaches (on average, how many days a week/month are they occurring)?   How long do the headaches last?  5 days Verify what preventative medication and dose you are taking (e.g. topiramate, propranolol, amitriptyline, Emgality, etc)  Emgality Verify which rescue medication you are taking (triptan, Advil, Excedrin, Aleve, Ubrelvy, etc)  Nurtec she is out How often are you taking pain relievers/analgesics/rescue mediction?        Nurtec samples needed, but patient wanted to know what she can do right now for the headache she had since Friday.

## 2023-05-01 NOTE — Telephone Encounter (Signed)
Called patient and left message to get her scheduled for EMG

## 2023-05-01 NOTE — Telephone Encounter (Signed)
Telephone call to patient no answer.  Per Dr.Jaffe.  She may come in for headache cocktail (if she has a driver), otherwise we can send a prednisone taper.

## 2023-05-02 ENCOUNTER — Other Ambulatory Visit (HOSPITAL_COMMUNITY): Payer: Self-pay

## 2023-05-02 NOTE — Telephone Encounter (Signed)
Patient advised by Mychart.

## 2023-05-02 NOTE — Telephone Encounter (Signed)
Patient Advocate Encounter  Received a fax from [facility] [employer] regarding Prior Authorization for Nurtec 75MG  dispersible tablets.  KEY: [id number]    Authorization has been CANCELLED due to Prior Authorization Appeal has been APPROVED.  Effective Dates: 04-13-2023 to 04-12-2024

## 2023-05-07 ENCOUNTER — Ambulatory Visit (INDEPENDENT_AMBULATORY_CARE_PROVIDER_SITE_OTHER): Payer: BC Managed Care – PPO

## 2023-05-07 DIAGNOSIS — G43111 Migraine with aura, intractable, with status migrainosus: Secondary | ICD-10-CM | POA: Diagnosis not present

## 2023-05-07 MED ORDER — KETOROLAC TROMETHAMINE 60 MG/2ML IM SOLN
60.0000 mg | Freq: Once | INTRAMUSCULAR | Status: AC
Start: 2023-05-07 — End: 2023-05-07
  Administered 2023-05-07: 60 mg via INTRAMUSCULAR

## 2023-05-07 MED ORDER — DIPHENHYDRAMINE HCL 50 MG/ML IJ SOLN
50.0000 mg | Freq: Once | INTRAMUSCULAR | Status: AC
Start: 2023-05-07 — End: 2023-05-07
  Administered 2023-05-07: 25 mg via INTRAMUSCULAR

## 2023-05-07 MED ORDER — METOCLOPRAMIDE HCL 5 MG/ML IJ SOLN
10.0000 mg | Freq: Once | INTRAMUSCULAR | Status: AC
Start: 2023-05-07 — End: 2023-05-07
  Administered 2023-05-07: 10 mg via INTRAMUSCULAR

## 2023-05-07 MED ORDER — TRIAMCINOLONE ACETONIDE 40 MG/ML IJ SUSP
40.0000 mg | Freq: Once | INTRAMUSCULAR | Status: DC
Start: 2023-05-07 — End: 2023-05-07

## 2023-05-08 ENCOUNTER — Other Ambulatory Visit: Payer: Self-pay

## 2023-05-08 MED ORDER — PREDNISONE 10 MG (21) PO TBPK: ORAL_TABLET | ORAL | 0 refills | Status: DC

## 2023-05-08 NOTE — Progress Notes (Signed)
Per Dr.Jaffe okay to send Prednisone taper 10 mg to the local pharmacy.

## 2023-05-09 ENCOUNTER — Ambulatory Visit
Admission: RE | Admit: 2023-05-09 | Discharge: 2023-05-09 | Disposition: A | Discharge status: Home / Self Care | Payer: BC Managed Care – PPO | Source: Ambulatory Visit | Attending: Neurology | Admitting: Neurology

## 2023-05-09 DIAGNOSIS — M4802 Spinal stenosis, cervical region: Secondary | ICD-10-CM | POA: Insufficient documentation

## 2023-05-10 NOTE — Telephone Encounter (Signed)
Sheena sent to PA team

## 2023-05-15 NOTE — Progress Notes (Signed)
LMOVM for patient to call back.

## 2023-05-16 NOTE — Progress Notes (Signed)
Per patient she having more pains in her arms after EMG of the arms. So she wanted to know if this will happen to her legs.

## 2023-05-23 ENCOUNTER — Ambulatory Visit: Payer: BC Managed Care – PPO | Admitting: Neurology

## 2023-05-28 ENCOUNTER — Ambulatory Visit (INDEPENDENT_AMBULATORY_CARE_PROVIDER_SITE_OTHER): Payer: BC Managed Care – PPO | Admitting: Neurology

## 2023-05-28 DIAGNOSIS — R202 Paresthesia of skin: Secondary | ICD-10-CM | POA: Diagnosis not present

## 2023-05-28 DIAGNOSIS — R200 Anesthesia of skin: Secondary | ICD-10-CM | POA: Diagnosis not present

## 2023-05-28 NOTE — Procedures (Signed)
[facility]  [address] [place], [address]  [place], [place] [address] Tel: [phone] Fax: [phone] Test Date:  05/28/2023  Patient: [name] DOB: 09-16-1971 Physician: Jacquelyne Balint, MD  Sex: Female Height: 5\' 1"  Ref Phys: Shon Millet, DO  ID#: [id number]   Technician:    History: This is a 52 year old female with numbness, tingling, pain, and weakness.  NCV & EMG Findings: Extensive electrodiagnostic evaluation of bilateral lower limbs shows: Bilateral sural, bilateral superficial peroneal/fibular, and right medial plantar sensory responses are within normal limits. Bilateral peroneal/fibular (EDB) and tibial (AH) motor responses are within normal limits. Bilateral H reflex latency is within normal limits. There is no evidence of active or chronic motor axon loss changes affecting any of the tested muscles. Motor unit configuration and recruitment pattern is within normal limits.  Impression: This is a normal study. In particular, there is no electrodiagnostic evidence of a right or left lumbosacral (L3-S1) radiculopathy, large fiber sensorimotor neuropathy, or myopathy.    ___________________________ Jacquelyne Balint, MD    Nerve Conduction Studies Motor Nerve Results    Latency Amplitude F-Lat Segment Distance CV Comment  Site (ms) Norm (mV) Norm (ms)  (cm) (m/s) Norm   Left Fibular (EDB) Motor  Ankle 3.1  < 6.0 4.9  > 2.5        Bel fib head 8.9 - 4.3 -  Bel fib head-Ankle 28 48  > 40   Pop fossa 10.1 - 4.3 -  Pop fossa-Bel fib head 7.5 63 -   Right Fibular (EDB) Motor  Ankle 2.6  < 6.0 5.1  > 2.5        Bel fib head 8.2 - 4.6 -  Bel fib head-Ankle 28 50  > 40   Pop fossa 9.8 - 4.6 -  Pop fossa-Bel fib head 8 50 -   Left Tibial (AH) Motor  Ankle 3.0  < 6.0 5.5  > 4.0        Knee 9.8 - 5.1 -  Knee-Ankle 35 51  > 40   Right Tibial (AH) Motor  Ankle 2.6  < 6.0 6.5  > 4.0        Knee 9.5 - 5.9 -  Knee-Ankle 37 54  > 40    Sensory Sites    Neg Peak Lat  Amplitude (O-P) Segment Distance Velocity Comment  Site (ms) Norm (V) Norm  (cm) (ms)   Right Medial Plantar (Ortho) Sensory  Great toe-Med mall 2.6 - 7 - Great toe-Med mall -    Left Superficial Fibular Sensory  14 cm-Ankle 2.5  < 4.6 20  > 4 14 cm-Ankle 14    Right Superficial Fibular Sensory  14 cm-Ankle 2.5  < 4.6 15  > 4 14 cm-Ankle 14    Left Sural Sensory  Calf-Lat mall 3.0  < 4.6 22  > 4 Calf-Lat mall 14    Right Sural Sensory  Calf-Lat mall 2.9  < 4.6 20  > 4 Calf-Lat mall 14     H-Reflex Results    M-Lat H Lat H Neg Amp H-M Lat  Site (ms) (ms) Norm (mV) (ms)  Left Tibial H-Reflex  Pop fossa 4.6 27.3  < 35.0 0.81 22.7  Right Tibial H-Reflex  Pop fossa 5.1 29.0  < 35.0 1.23 23.9   Electromyography   Side Muscle Ins.Act Fibs Fasc Recrt Amp Dur Poly Activation Comment  Left Tib ant Nml Nml Nml Nml Nml Nml Nml Nml N/A  Left Gastroc  MH Nml Nml Nml Nml Nml Nml Nml Nml N/A  Left Rectus fem Nml Nml Nml Nml Nml Nml Nml Nml N/A  Left Biceps fem SH Nml Nml Nml Nml Nml Nml Nml Nml N/A  Left Gluteus med Nml Nml Nml Nml Nml Nml Nml Nml N/A  Right Tib ant Nml Nml Nml Nml Nml Nml Nml Nml N/A  Right Gastroc MH Nml Nml Nml Nml Nml Nml Nml Nml N/A  Right Rectus fem Nml Nml Nml Nml Nml Nml Nml Nml N/A  Right Biceps fem SH Nml Nml Nml Nml Nml Nml Nml Nml N/A  Right Gluteus med Nml Nml Nml Nml Nml Nml Nml Nml N/A      Waveforms:  Motor           Sensory             H-Reflex

## 2023-05-29 ENCOUNTER — Telehealth: Payer: Self-pay

## 2023-05-29 NOTE — Telephone Encounter (Signed)
Patient advised of her lab results. Patient aware to stop by and have the lab drawn. Patient will try and stop by this week.

## 2023-05-29 NOTE — Telephone Encounter (Signed)
-----   Message from Cira Servant sent at 05/28/2023  4:30 PM EDT ----- Nerve conduction study is normal.  I have no neurologic explanation for her symptoms.  I would again like to check if the ANA + ENA panel was performed as status is still listed as "future".  This would complete workup.

## 2023-06-08 ENCOUNTER — Other Ambulatory Visit: Payer: BC Managed Care – PPO

## 2023-06-08 DIAGNOSIS — R200 Anesthesia of skin: Secondary | ICD-10-CM

## 2023-06-08 DIAGNOSIS — R202 Paresthesia of skin: Secondary | ICD-10-CM | POA: Diagnosis not present

## 2023-06-08 DIAGNOSIS — R29898 Other symptoms and signs involving the musculoskeletal system: Secondary | ICD-10-CM

## 2023-08-03 ENCOUNTER — Ambulatory Visit
Admission: EM | Admit: 2023-08-03 | Discharge: 2023-08-03 | Disposition: A | Discharge status: Home / Self Care | Payer: BC Managed Care – PPO

## 2023-08-03 DIAGNOSIS — M5441 Lumbago with sciatica, right side: Secondary | ICD-10-CM

## 2023-08-03 DIAGNOSIS — M5442 Lumbago with sciatica, left side: Secondary | ICD-10-CM | POA: Diagnosis not present

## 2023-08-03 MED ORDER — PREDNISONE 10 MG PO TABS: 40.0000 mg | ORAL_TABLET | Freq: Every day | ORAL | 0 refills | Status: AC

## 2023-08-03 NOTE — ED Provider Notes (Signed)
Renaldo Fiddler    CSN: [id number] Arrival date & time: 08/03/23  1251      History   Chief Complaint Chief Complaint  Patient presents with   Back Pain    HPI [name] is a 52 y.o. female.  Patient presents with bilateral low back pain which radiates to both legs x 1 week.  No falls or injury.  The pain started when she rolled over in bed.  Treatment attempted with Tylenol, ibuprofen, naproxen, Flexeril.  She denies numbness, weakness, saddle anesthesia, loss of bowel/bladder control, abdominal pain, dysuria, hematuria, fever, chills, or other symptoms.  Her medical history includes cervical spinal stenosis, chronic neck pain, DDD, fibromyalgia, arthritis, migraine headache.  The history is provided by the patient and medical records.    Past Medical History:  Diagnosis Date   Anemia    Anxiety    Chest pain    a. Felt noncardiac with normal stress echo 11/2012.   Cholelithiasis    a. on Abd [place] 11/2012.   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      Home Medications    Prior to Admission medications   Medication Sig Start Date End Date Taking? Authorizing Provider  Cholecalciferol (VITAMIN D3) 1.25 MG (50000 UT) CAPS Take 1 capsule by mouth once a week. 07/05/23  Yes [provider]  estradiol (ESTRACE) 0.1 MG/GM vaginal cream Place vaginally. 03/16/23  Yes [provider]  hydrOXYzine (ATARAX) 25 MG tablet Take 25-50 mg by mouth 2 (two) times daily as needed. 07/04/23  Yes [provider]  predniSONE (DELTASONE) 10 MG tablet Take 4 tablets (40 mg total) by mouth daily for 3 days. 08/03/23 08/06/23 Yes Mickie Bail, NP  atenolol (TENORMIN) 25 MG tablet Take by mouth daily.    [provider]  cyclobenzaprine (FLEXERIL) 10 MG tablet Take 1 tablet by mouth 2 (two) times daily. 08/31/18   [provider]  Galcanezumab-gnlm (EMGALITY) 120 MG/ML SOAJ Inject 120 mg into the skin every 28 (twenty-eight) days. 12/26/22   Drema Dallas, DO  losartan (COZAAR) 25 MG tablet Take 25 mg by mouth daily.    [provider]  nystatin cream (MYCOSTATIN) Apply 1 Application topically 2 (two) times daily. 12/05/22   [provider]  Rimegepant Sulfate (  NURTEC) 75 MG TBDP Take 1 tablet (75 mg total) by mouth daily as needed. 01/02/23   Drema Dallas, DO    Family History Family History  Problem Relation Age of Onset   Migraines Mother    Heart disease Father    Alcohol abuse Father    Heart attack Father    Migraines Maternal Aunt    Breast cancer Maternal Aunt    Depression Maternal Aunt    Migraines Maternal Aunt    Heart disease Paternal Uncle    Diabetes Paternal Uncle    Stroke Paternal Uncle    Ovarian cancer Maternal Grandmother    Heart disease Maternal Grandmother    Stroke Maternal Grandmother    Depression Maternal Grandmother    Heart  attack Maternal Grandmother    Hypertension Maternal Grandmother    Heart disease Maternal Grandfather    Heart attack Maternal Grandfather    Heart disease Paternal Grandmother    Heart attack Paternal Grandmother    Diabetes Paternal Grandfather    Stroke Paternal Grandfather    Migraines Son    Migraines Son     Social History Social History   Tobacco Use   Smoking status: Former    Current packs/day: 0.00    Average packs/day: 0.1 packs/day for 1.3 years (0.1 ttl pk-yrs)    Types: Cigarettes    Start date: 04/10/2012    Quit date: 08/07/2013    Years since quitting: 9.9   Smokeless tobacco: Never  Vaping Use   Vaping status: Never Used  Substance Use Topics   Alcohol use: Yes    Comment: 1-2 a year   Drug use: No     Allergies   Other, Latex, Amoxicillin, Contrast media [iodinated contrast media], Iohexol, Doxycycline, and Erythromycin   Review of Systems Review of Systems  Constitutional:  Negative for chills and fever.  Gastrointestinal:  Negative for abdominal pain, constipation, diarrhea and vomiting.  Genitourinary:  Negative for dysuria and hematuria.  Musculoskeletal:  Positive for back pain. Negative for joint swelling.  Skin:  Negative for color change, rash and wound.  Neurological:  Negative for weakness and numbness.     Physical Exam Triage Vital Signs ED Triage Vitals  Encounter Vitals Group     BP 08/03/23 1342 113/81     Systolic BP Percentile --      Diastolic BP Percentile --      Pulse Rate 08/03/23 1334 95     Resp 08/03/23 1334 18     Temp 08/03/23 1334 98 F (36.7 C)     Temp src --      SpO2 08/03/23 1334 97 %     Weight --      Height --      Head Circumference --      Peak Flow --      Pain Score 08/03/23 1339 6     Pain Loc --      Pain Education --      Exclude from Growth Chart --    No data found.  Updated Vital Signs BP 113/81   Pulse 95   Temp 98 F (36.7 C)   Resp 18   LMP 09/04/2018 (Exact Date)   SpO2  97%   Visual Acuity Right Eye Distance:   Left Eye Distance:   Bilateral Distance:    Right Eye Near:   Left Eye Near:    Bilateral Near:     Physical Exam Constitutional:  General: She is not in acute distress. HENT:     Mouth/Throat:     Mouth: Mucous membranes are moist.  Cardiovascular:     Rate and Rhythm: Normal rate and regular rhythm.     Heart sounds: Normal heart sounds.  Pulmonary:     Effort: Pulmonary effort is normal. No respiratory distress.     Breath sounds: Normal breath sounds.  Abdominal:     General: Bowel sounds are normal.     Palpations: Abdomen is soft.     Tenderness: There is no abdominal tenderness. There is no right CVA tenderness, left CVA tenderness, guarding or rebound.  Musculoskeletal:        General: Tenderness present. No swelling or deformity. Normal range of motion.     Comments: Mild tenderness across lower back.  No spine tenderness.  Skin:    General: Skin is warm and dry.     Findings: No bruising, erythema, lesion or rash.  Neurological:     General: No focal deficit present.     Mental Status: She is alert and oriented to person, place, and time.     Sensory: No sensory deficit.     Motor: No weakness.     Gait: Gait normal.  Psychiatric:        Mood and Affect: Mood normal.        Behavior: Behavior normal.      UC Treatments / Results  Labs (all labs ordered are listed, but only abnormal results are displayed) Labs Reviewed - No data to display  EKG   Radiology No results found.  Procedures Procedures (including critical care time)  Medications Ordered in UC Medications - No data to display  Initial Impression / Assessment and Plan / UC Course  I have reviewed the triage vital signs and the nursing notes.  Pertinent labs & imaging results that were available during my care of the patient were reviewed by me and considered in my medical decision making (see chart for details).    Bilateral low back  pain with bilateral sciatica.  Afebrile and vital signs are stable.  Patient has been treating herself with NSAIDs and muscle relaxer without relief.  Treating today with 3-day course of prednisone.  Instructed her to follow-up with orthopedics.  Contact information for on-call Ortho provided.  Education provided on sciatica.  ED precautions given.  Patient agrees to plan of care.  Final Clinical Impressions(s) / UC Diagnoses   Final diagnoses:  Acute bilateral low back pain with bilateral sciatica     Discharge Instructions      Take the prednisone as directed.  Follow-up with an orthopedist such as the one listed below.  Go to the emergency department if you have worsening symptoms.     ED Prescriptions     Medication Sig Dispense Auth. Provider   predniSONE (DELTASONE) 10 MG tablet Take 4 tablets (40 mg total) by mouth daily for 3 days. 12 tablet Mickie Bail, NP      I have reviewed the PDMP during this encounter.   Mickie Bail, NP 08/03/23 1414

## 2023-08-03 NOTE — Discharge Instructions (Addendum)
Take the prednisone as directed.  Follow-up with an orthopedist such as the one listed below.  Go to the emergency department if you have worsening symptoms.

## 2023-08-03 NOTE — ED Triage Notes (Signed)
Patient to Urgent Care with complaints of upper/ right sided back pain following an injury that occurred six days ago.  Reports she turned over in bed, felt a sharp pain shoot through her back. Pain starts in her upper back/ right shoulder area and now radiates into her hip.   Has been taking ibuprofen/ tylenol/ naproxen/ muscle relaxers.

## 2023-10-08 NOTE — Progress Notes (Deleted)
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  MRI of brain and C-spine this year personally reviewed.  UPDATE: Migraines: Due to experiencing new focal symptoms with migraines (trouble getting words out, dizziness), she had an MRI of brain with and without contrast performed on 01/21/2023 which was normal.  Started Emgality. Intensity:  *** Duration:  *** with Nurtec *** Frequency:  ***  Current NSAIDS/analgesics:  Goodys, Excedrin Migraine, Tylenol, ibuprofen, ASA, celecoxib 1000mg  daily Current triptans:  none Current ergotamine:  none Current anti-emetic:  none Current muscle relaxants:  cyclobenzaprine 10mg  TID Current Antihypertensive medications:  atenolol 25mg  daily, losartan 25mg  daily Current Antidepressant medications:  none Current Anticonvulsant medications:  none Current anti-CGRP:  Emgality, Nurtec PRN Current Vitamins/Herbal/Supplements:  none Current Antihistamines/Decongestants:  meclizine Other therapy:  none Birth control:  none     Caffeine:  no Diet:  over 100 oz daily.  Does not skip meals. Exercise:  yoga daily Depression:  no; Anxiety:  yes Sleep hygiene:  poor.  Trouble falling asleep and staying asleep. On average, 4-5 hours of sleep per night.  Sometimes cannot sleep at all.  Ambien caused side effects.  Pain/Numbness/Weakness: Underwent extensive workup for pain, numbness and weakness of her extremities.  05/09/2023 MRI C-SPINE WO:  1. Multilevel cervical spondylosis with resultant mild diffuse spinal stenosis at C4-5 through C6-7.  2. Multifactorial degenerative changes with resultant multilevel foraminal narrowing as above. Notable findings include mild to moderate right C5 foraminal stenosis, moderate left C6 foraminal narrowing, with mild left C7 foraminal stenosis.3. 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  01/24/2023 NCV-EMG UEs:  1. 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  2.  Mild left median sensory nerve focal, demyelinating mononeuropathy at or near the wrist. 05/28/2023 NCV-EMG LEs:  This is a normal study. In particular, there is no electrodiagnostic evidence of a right or left lumbosacral (L3-S1) radiculopathy, large fiber sensorimotor neuropathy, or myopathy.   05-05/2023 LABS:  ANA negative, negative ENA panel (dsDNA, RNP, SM Ab, Scl-70, SSA/SSB Abs), B12 345, TSH 1.54, IFE unremarkable  ***.  She was seen in the ED in September for acute on chronic low back pain with bilateral radiculitis.  Prescribed prednisone taper.  HISTORY: Migraines: Onset:  52 years old. Location:  diffuse Quality:  pressure, stabbing Intensity:  severe Aura:  halos, shadows, spots,  Prodrome:  absent Associated symptoms:  nausea, sometimes vomiting, photophobia, phonophobia, .  She denies associated unilateral numbness or weakness. Duration:  3-5 days Frequency:  10-15 days a month Triggers:  menstrual cycle, sugar, smells (perfumes, lotion, cologne, chemicals, burning) Relieving factors:  wrap head in ice. Activity:  aggravates.   They have been worse.  She has had an intractable migraine for 2 straight weeks, not responding to migraine cocktails.  Also, she now has new symptom with her migraine, difficulty understanding others and getting words out.  She also has been dizzy, described as lightheaded.  Past NSAIDS/analgesics:  naproxen Past abortive triptans:  sumatriptan tab, rizatriptan Past abortive ergotamine:  none Past muscle relaxants:  none  Past anti-emetic:   promethazine Past antihypertensive medications:  valsartan Past antidepressant medications:  venlafaxine Past anticonvulsant medications:  topiramate Past anti-CGRP:  none Past vitamins/Herbal/Supplements:  none Past antihistamines/decongestants:  Benadryl Other past therapies:  Botox  Family history of headache:  mother, maternal aunts   Pain/Numbness/Weakness: She reports bilateral hand pain and numbness since 2018.  It would always wake her up at night.  She reports cramping and sharp pain, sometimes hands (thumbs in particular) lock up.  Entire hand is numb radiating up to elbows.  Elbows hurt.  Upper arms cramp.  Shoulders hurt.  Due to numbness, she cannot feel the heat, whether using the stove or hot water.  She has [facility]CV[facility]EMG in March at [facility] that reportedly showed just carpal tunnel syndrome.  She saw orthopedics who performed steroid injections into the carpal tunnels which did not work.  She started experiencing pain and numbness in the lower extremities around 2022.  She has history of chronic neck pain with radicular symptoms as well as fibromyalgia.    11/23/2014 X-RAY L-SPINE:  The lumbar vertebral bodies are preserved in height. The intervertebral disc space heights are well maintained. There is no spondylolisthesis. There is mild facet joint hypertrophy at L4-5 and L5-S1 bilaterally. The pedicles and transverse processes are intact. The observed portions of the sacrum are normal.There is no significant disc space narrowing and no compression fracture. 10/11/2016 MRI BRAIN W WO:  1.   Single T2/FLAIR hyperintense focus in the white matter of the right frontal lobe that was also seen on the 2010 MRI.    This is unlikely to be of clinical significance and likely represents a small chronic microvascular ischemic focus.  2.    There are no acute findings and there is a normal enhancement pattern.  10/13/2016 MRI C-SPINE WO:  1. No evidence of myelopathy.  2. Upper  cervical facet arthropathy and mid cervical disc degeneration as described.  3. 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 C5-6 moderate left foraminal narrowing from uncovertebral spur.  4. 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   PAST MEDICAL HISTORY: Past Medical History:  Diagnosis Date   Anemia    Anxiety    Chest pain    a. Felt noncardiac with normal stress echo 11/2012.   Cholelithiasis    a. on Abd [place] 11/2012.   Complication of anesthesia    Depression    GERD (gastroesophageal reflux disease)    Hypertension    Migraines    PONV (postoperative nausea and vomiting)    Radiculopathy of cervical region    Stroke [facility]) 11/13/2005   TIA    MEDICATIONS: Current Outpatient Medications on File Prior to Visit  Medication Sig Dispense Refill   atenolol (TENORMIN) 25 MG tablet Take by mouth daily.     Cholecalciferol (VITAMIN D3) 1.25 MG (50000 UT) CAPS Take 1 capsule by mouth once a week.     cyclobenzaprine (FLEXERIL) 10 MG tablet Take 1 tablet by mouth 2 (two) times daily.  0   estradiol (ESTRACE) 0.1 MG/GM vaginal cream Place vaginally.     Galcanezumab-gnlm (EMGALITY) 120 MG/ML SOAJ Inject 120 mg into the skin every 28 (twenty-eight) days. 1.12 mL 11   hydrOXYzine (ATARAX) 25 MG tablet Take 25-50 mg by mouth 2 (two) times daily as needed.     losartan (COZAAR) 25 MG tablet Take 25 mg by mouth daily.     nystatin cream (MYCOSTATIN) Apply 1 Application topically 2 (two) times daily.  Rimegepant Sulfate (NURTEC) 75 MG TBDP Take 1 tablet (75 mg total) by mouth daily as needed. 8 tablet 11   No current facility-administered medications on file prior to visit.    ALLERGIES: Allergies  Allergen Reactions   Other Nausea And Vomiting    "sleeping gas"= makes me vomit  (even when a sleep)   Latex Hives, Itching and Rash   Amoxicillin Nausea Only   Contrast Media [Iodinated Contrast Media]     CT   Iohexol      Desc: Pt had throat tightness, slight swelling. Itchy  eyes and itchy gums.  Dr Molli Posey spoke w/ pt.  Treating this like possible/early reaction.  Pt was counseled. Needs full premeds w/ IV contrast, txs., Onset Date: [id number]    Doxycycline Rash   Erythromycin Rash    FAMILY HISTORY: Family History  Problem Relation Age of Onset   Migraines Mother    Heart disease Father    Alcohol abuse Father    Heart attack Father    Migraines Maternal Aunt    Breast cancer Maternal Aunt    Depression Maternal Aunt    Migraines Maternal Aunt    Heart disease Paternal Uncle    Diabetes Paternal Uncle    Stroke Paternal Uncle    Ovarian cancer Maternal Grandmother    Heart disease Maternal Grandmother    Stroke Maternal Grandmother    Depression Maternal Grandmother    Heart attack Maternal Grandmother    Hypertension Maternal Grandmother    Heart disease Maternal Grandfather    Heart attack Maternal Grandfather    Heart disease Paternal Grandmother    Heart attack Paternal Grandmother    Diabetes Paternal Grandfather    Stroke Paternal Grandfather    Migraines Son    Migraines Son       Objective:  *** General: No acute distress.  Patient appears well-groomed.   Head:  Normocephalic/atraumatic Eyes:  Fundi examined but not visualized Neck: supple, no paraspinal tenderness, full range of motion Heart:  Regular rate and rhythm Neurological Exam: alert and oriented.  Speech fluent and not dysarthric, language intact.  Left sided facial numbness.  Otherwise, CN II-XII intact. Bulk and tone normal, muscle strength at least 4+/5 but query decreased effort.  Sensation to pinprick reduced in entire hand but most prominently in first first 4 digits of right hand and 3rd and 5th digits of left hand radiating up to shoulders and in feet radiating up to knees.  Vibratory sensation reduced in upper and lower extremities.  Deep tendon reflexes 2+ throughout, toes downgoing.  Finger to nose testing intact.  Gait normal, Romberg negative. ***   Shon Millet, DO  CC: Deberah Castle, NP

## 2023-10-09 ENCOUNTER — Ambulatory Visit: Payer: BC Managed Care – PPO | Admitting: Neurology

## 2023-11-20 ENCOUNTER — Other Ambulatory Visit: Payer: Self-pay | Admitting: Urology

## 2023-11-21 NOTE — Progress Notes (Signed)
 Attempted to call patient for Litho on Friday. No voicemail set up, no answer.

## 2023-11-22 ENCOUNTER — Encounter (HOSPITAL_BASED_OUTPATIENT_CLINIC_OR_DEPARTMENT_OTHER): Payer: Self-pay | Admitting: Urology

## 2023-11-22 NOTE — Progress Notes (Addendum)
 Spoke w/ via phone for pre-op interview--- [facility] needs dos----              none COVID test -----patient states asymptomatic no test needed Arrive at ------- 0800 NPO after MN NO Solid Food.  Clear liquids from MN until--- NPO Med rec completed. Pt aware to hold ASA/NSAIDs and supplements per [facility] protocol. - Per pt has held ASA/NSAIDs ~ 1 week Medications to take morning of surgery ----- none Diabetic/Weight loss medication ----- n/a No Alcohol or recreational drugs for 24 hours/Tobacco products for 6 hours ---- pt aware Patient instructed to bring blue lithotripsy folder, photo id and insurance card day of surgery. Patient aware to have Driver (ride ) / caregiver for 24 hours after surgery ----- Particia (son) Patient Special Instructions ----- Pre-Op special Instructions ----- take laxative of choice day before procedure Patient verbalized understanding of instructions that were given at this phone interview. Patient denies shortness of breath, fever, cough at this phone interview.  Pt reports chest pain today 11/22/23 with radiation to shoulder blade. Pt states this is an ongoing issue and has had normal EKG at PCP Dr. Nestor a couple weeks ago. Unable to see EKG or notes in EMR/CE. Pt feels it is not related to anxiety or reflux and has not been given a diagnosis. Pt states she does not have a cardiologist. Encouraged pt to follow-up with PCP. LVM with Zona @ [facility] to report recent chest pain and possible need for cardiac clearance.   1317: spoke with [facility] @ [facility], relayed concern of pt's report of chest pain, unable to see pt's past EKG, and no cardiac work-up/clearance. Per Zona, will contact pt and initiate clearance.

## 2023-11-22 NOTE — Progress Notes (Signed)
 Left voicemail for [name] about 1510, requesting to call me and the need to talk with patient concerning cancellation of procedure tomorrow.  Spoke with [facility] at 1550 and they will get a message to [name] to please call patient due to her concerns about procedure being cancelled.

## 2023-11-23 ENCOUNTER — Ambulatory Visit (HOSPITAL_BASED_OUTPATIENT_CLINIC_OR_DEPARTMENT_OTHER): Admission: RE | Admit: 2023-11-23 | Payer: BC Managed Care – PPO | Source: Home / Self Care | Admitting: Urology

## 2023-11-23 HISTORY — DX: Fibromyalgia: M79.7

## 2023-11-23 SURGERY — LITHOTRIPSY, ESWL
Anesthesia: LOCAL | Laterality: Right

## 2023-11-28 NOTE — Progress Notes (Signed)
NEUROLOGY FOLLOW UP OFFICE NOTE  KANYIA MORIARITY [id number]  Assessment/Plan:   Migraine with aura, without status migrainosus, not intractable Bilateral upper and lower extremity pain, numbness and weakness.  Etiology unclear for upper extremities.  Semiology of lower extremities consistent with lumbosacral radiculopathy/sciatica - failed PT    Migraine prevention:  Plan to start Vyepti 300mg  every 3 months Migraine rescue:  Nurtec once daily as needed MRI of lumbar spine without contrast Limit use of pain relievers to no more than 2 days out of week to prevent risk of rebound or medication-overuse headache. Keep headache diary Follow up 6-7 months.       Subjective:  [name] is a 53 year old female with HTN, anxiety, and history of psychogenic movements who follows up for migraines and numbness.  MRI of brain and C-spine personally reviewed.  UPDATE: Migraines: Started Scientist, research (life sciences).  Stopped Emgality after 3 months because ineffective.  Nurtec effective. Duration:  within 2 hours with Nurtec (on a couple of occasions, ineffective and had to repeat dose next day.  Frequency:  15 days a month.    Current NSAIDS/analgesics:  Goodys, Excedrin Migraine, Tylenol, ibuprofen, ASA, celecoxib 1000mg  daily Current triptans:  none Current ergotamine:  none Current anti-emetic:  none Current muscle relaxants:  cyclobenzaprine 10mg  TID Current Antihypertensive medications:  atenolol 25mg  daily Current Antidepressant medications:  none Current Anticonvulsant medications:  none Current anti-CGRP:  Nurtec PRN Current Vitamins/Herbal/Supplements:  none Current Antihistamines/Decongestants:  meclizine Other therapy:  none Birth control:  none     Caffeine:  no Diet:  over 100 oz daily.  Does not skip meals. Exercise:  yoga daily Depression:  no; Anxiety:  yes Sleep hygiene:  poor.  Trouble falling asleep and staying asleep. On average, 4-5 hours of sleep per night.   Sometimes cannot sleep at all.  Ambien caused side effects.  Bilateral upper and lower extremity pain, weakness and numbness: 01/24/2023 NCV-EMG UPPER EXTREMITIES:  1. 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  2.  Mild left median sensory nerve focal, demyelinating mononeuropathy at or near the wrist. 05/09/2023 MRI C-SPINE:  1. Multilevel cervical spondylosis with resultant mild diffuse spinal stenosis at C4-5 through C6-7. 2. Multifactorial degenerative changes with resultant multilevel foraminal narrowing as above. Notable findings include mild to moderate right C5 foraminal stenosis, moderate left C6 foraminal narrowing, with mild left C7 foraminal stenosis. 3. 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 05/28/2023 NCV-EMG LOWER EXTREMITIES:  This is a normal study.  In particular, there is no electrodiagnostic evidence of a right or left lumbosacral (L3-S1) radiculopathy, large fiber sensorimotor neuropathy, or myopathy. 5/29-7/26/2024 LABS:  B12 345, TSH 1.54, IFE normal, negative ANA, negative ENA panel.  In September, developed bilateral lower back pain radiating down both legs.  No preceding fall or injury.  Started after she rolled over in bed.  No saddle anesthesia, loss of bowel or bladder control, numbness or weakness of the legs.  Seen in the ED after one week of symptoms on 9/20.  Treated with 3-day course of prednisone.  Physical therapy ineffective.  HISTORY: Migraines: Onset:  53 years old. Location:  diffuse Quality:  pressure, stabbing Intensity:  severe Aura:  halos, shadows, spots,  Prodrome:  absent Associated symptoms:  nausea, sometimes vomiting, photophobia, phonophobia, .  She denies associated unilateral numbness or weakness.  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 Activity:  aggravates.  Past NSAIDS/analgesics:  naproxen Past abortive triptans:  sumatriptan tab, rizatriptan Past abortive ergotamine:  none Past muscle relaxants:  none Past anti-emetic:  promethazine Past antihypertensive medications:  valsartan, losartan Past antidepressant medications:  venlafaxine Past anticonvulsant medications:  topiramate Past anti-CGRP:  Emgality Past vitamins/Herbal/Supplements:  none Past antihistamines/decongestants:  Benadryl Other past therapies:  Botox  Family history of headache:  mother, maternal aunts   Bilateral upper and lower extremity pain, weakness and numbness: She reports bilateral hand pain and numbness since 2018.  It would always wake her up at night.  She reports cramping and sharp pain, sometimes hands (thumbs in particular) lock up.  Entire hand is numb radiating up to elbows.  Elbows hurt.  Upper arms cramp.  Shoulders hurt.  Due to numbness, she cannot feel the heat, whether using the stove or hot water.  She has NCV-EMG in March at [facility] that reportedly showed just carpal tunnel syndrome.  She saw orthopedics who performed steroid injections into the carpal tunnels which did not work.  PT for neck pain ineffective.  She has history of chronic neck pain with radicular symptoms as well as fibromyalgia.  She started experiencing pain and numbness in the lower extremities in 2022.    10/11/2016 MRI BRAIN W WO:  1.   Single T2/FLAIR hyperintense focus in the white matter of the right frontal lobe that was also seen on the 2010 MRI.    This is unlikely to be of clinical significance and likely represents a small chronic microvascular ischemic focus.  2.    There are no acute findings and there is a normal enhancement pattern. 10/13/2016 MRI C-SPINE WO:  1. No evidence of  10/13/2016 MRI C-SPINE WO:  1. No evidence of myelopathy.  2. Upper  cervical facet arthropathy and mid cervical disc degeneration as described.  3. 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 C5-6 moderate left foraminal narrowing from uncovertebral spur.  4. 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   PAST MEDICAL HISTORY: Past Medical History:  Diagnosis Date   Anemia    Anxiety    w panic disorder   Chest pain    a. Felt noncardiac with normal stress echo 11/2012. per pt having episodes as recently as today 11/22/23, no cardiologist, follows with PCP D. Tetter @ [facility]   Cholelithiasis    a. on Abd [place] 11/2012.   Complication of anesthesia    Depression    Fibromyalgia    GERD (gastroesophageal reflux disease)    Hypertension    Migraines    PONV (postoperative nausea and vomiting)    W/ anesthesia gas   Radiculopathy of cervical region    Stroke [facility]) 11/13/2005   TIA, no residual effects    MEDICATIONS: Current Outpatient Medications on File Prior to Visit  Medication Sig Dispense Refill   aspirin EC 81 MG tablet Take 81 mg by mouth daily. Swallow whole.     atenolol (TENORMIN) 25 MG tablet Take by mouth daily.     Cholecalciferol (VITAMIN D3) 1.25 MG (50000 UT) CAPS Take 1 capsule by mouth once a week.     cyclobenzaprine (FLEXERIL) 10 MG tablet Take 1 tablet by mouth 2 (two) times daily.  0   Rimegepant Sulfate (NURTEC) 75 MG TBDP Take 1 tablet (75 mg total) by mouth daily as needed.  8 tablet 11   nystatin cream (MYCOSTATIN) Apply 1 Application topically 2 (two) times daily. (Patient not taking: Reported on 11/29/2023)     No current facility-administered medications on file prior to visit.     ALLERGIES: Allergies  Allergen Reactions   Other Nausea And Vomiting    "sleeping gas"= makes me vomit  (even when a sleep)   Latex Hives, Itching and Rash   Amoxicillin Nausea Only    Pill form only, ok with liquid   Contrast Media [Iodinated Contrast Media]     CT   Iohexol      Desc: Pt had throat tightness, slight swelling. Itchy  eyes and itchy gums.  Dr Molli Posey spoke w/ pt.  Treating this like possible/early reaction.  Pt was counseled. Needs full premeds w/ IV contrast, txs., Onset Date: [id number]    Doxycycline Rash   Erythromycin Rash    FAMILY HISTORY: Family History  Problem Relation Age of Onset   Migraines Mother    Heart disease Father    Alcohol abuse Father    Heart attack Father    Migraines Maternal Aunt    Breast cancer Maternal Aunt    Depression Maternal Aunt    Migraines Maternal Aunt    Heart disease Paternal Uncle    Diabetes Paternal Uncle    Stroke Paternal Uncle    Ovarian cancer Maternal Grandmother    Heart disease Maternal Grandmother    Stroke Maternal Grandmother    Depression Maternal Grandmother    Heart attack Maternal Grandmother    Hypertension Maternal Grandmother    Heart disease Maternal Grandfather    Heart attack Maternal Grandfather    Heart disease Paternal Grandmother    Heart attack Paternal Grandmother    Diabetes Paternal Grandfather    Stroke Paternal Grandfather    Migraines Son    Migraines Son       Objective:  Blood pressure 110/70, pulse 68, resp. rate 20, weight 145 lb (65.8 kg), last menstrual period 09/04/2018, SpO2 97%. General: No acute distress.  Patient appears well-groomed.     Shon Millet, DO  CC: Deberah Castle, NP

## 2023-11-29 ENCOUNTER — Encounter: Payer: Self-pay | Admitting: Neurology

## 2023-11-29 ENCOUNTER — Telehealth: Payer: Self-pay

## 2023-11-29 ENCOUNTER — Ambulatory Visit (INDEPENDENT_AMBULATORY_CARE_PROVIDER_SITE_OTHER): Payer: BC Managed Care – PPO | Admitting: Neurology

## 2023-11-29 VITALS — BP 110/70 | HR 68 | Resp 20 | Wt 145.0 lb

## 2023-11-29 DIAGNOSIS — G43111 Migraine with aura, intractable, with status migrainosus: Secondary | ICD-10-CM | POA: Insufficient documentation

## 2023-11-29 DIAGNOSIS — M4726 Other spondylosis with radiculopathy, lumbar region: Secondary | ICD-10-CM

## 2023-11-29 NOTE — Telephone Encounter (Signed)
PA PA Denied MR Lumbar Spine W/O  will need PEER-PEER [id number].

## 2023-11-29 NOTE — Patient Instructions (Addendum)
Plan to start Vyepti 300mg  IV every 3 months. [facility] will contact you to schedule.  Nurtec one daily as needed for migraine attacks MRI of lumbar spine without contrast  Follow up 7 months.

## 2023-12-03 ENCOUNTER — Telehealth: Payer: Self-pay | Admitting: Pharmacy Technician

## 2023-12-03 NOTE — Telephone Encounter (Signed)
Dr. Everlena Cooper / Zenon Mayo,  Vypeti has been denied due to patient must try and failed ALL of the alternative medications. Aimovig Emgality Ajovy  You have the right to appeal if you would like. The denial letter has been scanned to the media for your review.  We will d/c the treatment plan.  @[email or web address]  - please d/c the treatment plan.  Thanks Kim.

## 2023-12-04 NOTE — Telephone Encounter (Signed)
Tried calling patient no answer, unable to Lvm.  Mychart message sent with Dr.Jaffe note. We will have to let patient know that her insurance requires that she try all 3 monthly injections.  If she is agreeable, please send in prescription for Aimovig 140mg  every 28 days.

## 2023-12-05 ENCOUNTER — Telehealth: Payer: Self-pay

## 2023-12-05 MED ORDER — AIMOVIG 140 MG/ML ~~LOC~~ SOAJ: 140.0000 mg | SUBCUTANEOUS | 5 refills | Status: DC

## 2023-12-05 NOTE — Telephone Encounter (Signed)
Per Dr. Everlena Cooper,  Dr. Everlena Cooper / Zenon Mayo,   [name] has been denied due to patient must try and failed ALL of the alternative medications. Aimovig Emgality Ajovy    Per DR.Jaffe, We will have to let patient know that her insurance requires that she try all 3 monthly injections.  If she is agreeable, please send in prescription for Aimovig 140mg  every 28 days.

## 2023-12-07 ENCOUNTER — Other Ambulatory Visit: Payer: Self-pay

## 2023-12-07 MED ORDER — AJOVY 225 MG/1.5ML ~~LOC~~ SOAJ: 225.0000 mg | SUBCUTANEOUS | 5 refills | Status: DC

## 2023-12-07 NOTE — Progress Notes (Signed)
Per Dr.Jaffe, We can document that patient not candidate for Aimovig due to history of uncontrolled HTN, and she as previously tried [employer].  However, she will need to still try Ajovy injection.  If she is agreeable, please send prescription for Ajovy every 28 days.

## 2023-12-18 NOTE — Telephone Encounter (Signed)
Per patient my chart message

## 2023-12-26 ENCOUNTER — Telehealth: Payer: Self-pay

## 2023-12-26 NOTE — Telephone Encounter (Signed)
PA needed for Nurtec.

## 2023-12-28 NOTE — Telephone Encounter (Signed)
Request from pharmacy PA status

## 2024-01-03 ENCOUNTER — Other Ambulatory Visit (HOSPITAL_COMMUNITY): Payer: Self-pay

## 2024-01-03 ENCOUNTER — Telehealth: Payer: Self-pay | Admitting: Pharmacy Technician

## 2024-01-03 NOTE — Telephone Encounter (Signed)
Pharmacy Patient Advocate Encounter  Received notification from [facility] that Prior Authorization for AJOVY 225MG  has been APPROVED from 2.20.25 to 5.15.25. Ran test claim, Copay is $24.98. This test claim was processed through [facility]- copay amounts may vary at other pharmacies due to pharmacy/plan contracts, or as the patient moves through the different stages of their insurance plan.   PA #/Case ID/Reference #: [id number]

## 2024-01-03 NOTE — Telephone Encounter (Signed)
Pharmacy Patient Advocate Encounter   Received notification from Pt Calls Messages that prior authorization for AJOVY (fremanezumab-vfrm) injection 225MG /1.5ML auto-injectors is required/requested.   Insurance verification completed.   The patient is insured through [facility] .   Per test claim: PA required; PA submitted to above mentioned insurance via CoverMyMeds Key/confirmation #/EOC [facility] Status is pending

## 2024-01-03 NOTE — Telephone Encounter (Signed)
PA request has been Submitted. New Encounter created for follow up. For additional info see Pharmacy Prior Auth telephone encounter from 01-03-2024.

## 2024-01-20 ENCOUNTER — Other Ambulatory Visit: Payer: Self-pay | Admitting: Neurology

## 2024-02-04 ENCOUNTER — Other Ambulatory Visit: Payer: Self-pay | Admitting: Neurology

## 2024-02-04 ENCOUNTER — Encounter: Payer: Self-pay | Admitting: Neurology

## 2024-02-05 ENCOUNTER — Other Ambulatory Visit: Payer: Self-pay | Admitting: Neurology

## 2024-02-05 MED ORDER — AJOVY 225 MG/1.5ML ~~LOC~~ SOAJ: 225.0000 mg | SUBCUTANEOUS | 5 refills | Status: DC

## 2024-02-05 MED ORDER — NURTEC 75 MG PO TBDP: 1.0000 | ORAL_TABLET | Freq: Every day | ORAL | 5 refills | Status: DC | PRN

## 2024-02-07 ENCOUNTER — Telehealth: Payer: Self-pay

## 2024-02-07 NOTE — Telephone Encounter (Signed)
 PA approved 02/05/24-03/07/24, Auth # [id number]

## 2024-02-08 ENCOUNTER — Other Ambulatory Visit: Payer: Self-pay

## 2024-02-08 NOTE — Telephone Encounter (Signed)
 Per patient needs a refill for Nurtec.   Refill sent 02/05/24

## 2024-02-17 ENCOUNTER — Ambulatory Visit
Admission: RE | Admit: 2024-02-17 | Discharge: 2024-02-17 | Disposition: A | Discharge status: Home / Self Care | Source: Ambulatory Visit | Attending: Neurology | Admitting: Neurology

## 2024-02-17 DIAGNOSIS — M4726 Other spondylosis with radiculopathy, lumbar region: Secondary | ICD-10-CM | POA: Diagnosis present

## 2024-03-12 ENCOUNTER — Telehealth: Payer: Self-pay

## 2024-03-12 ENCOUNTER — Other Ambulatory Visit: Payer: Self-pay

## 2024-03-12 DIAGNOSIS — M545 Low back pain, unspecified: Secondary | ICD-10-CM

## 2024-03-12 DIAGNOSIS — M5416 Radiculopathy, lumbar region: Secondary | ICD-10-CM

## 2024-03-12 NOTE — Telephone Encounter (Signed)
-----   Message from [place] Tat sent at 03/12/2024  9:38 AM EDT ----- Let pt know that MRI lumbar spine showed degenerative/arthritic changes and a cyst that could be causing the pain.  If agreeable and she would like, send referral to Dr. Jeris Montes in [place], [facility] neurosurgery for dx:  low back pain, lumbar radiculopathy

## 2024-03-19 NOTE — Progress Notes (Unsigned)
 Referring Physician:  Ellene Gustin, MD [address] [place],  [place] [address]  Primary Physician:  Teofilo Fellers, NP  History of Present Illness: 03/20/2024 [name] is here today with a chief complaint of back pain as well as left greater than right leg pain.  She has pain into her buttocks that goes down the back of her legs.  She feels off balance.  Her pain can be as bad as 10 out of 10 particularly with bending, walking, sitting, and standing.  Nothing really helps.  She has not recently done conservative management, but this is beginning to interfere with her life. Bowel/Bladder Dysfunction: none  Conservative measures:  Physical therapy:  has not participated in PT Multimodal medical therapy including regular antiinflammatories: Flexeril, Tylenol  Injections:  no epidural steroid injections  Past Surgery: no spinal surgeries   [name] has no symptoms of cervical myelopathy.  The symptoms are causing a significant impact on the patient's life.   I have utilized the care everywhere function in epic to review the outside records available from external health systems.  Review of Systems:  A 10 point review of systems is negative, except for the pertinent positives and negatives detailed in the HPI.  Past Medical History: Past Medical History:  Diagnosis Date   Anemia    Anxiety    w panic disorder   Chest pain    a. Felt noncardiac with normal stress echo 11/2012. per pt having episodes as recently as today 11/22/23, no cardiologist, follows with PCP D. Tetter @ [place]   Cholelithiasis    a. on Abd US  11/2012.   Complication of anesthesia    Depression    Fibromyalgia    GERD (gastroesophageal reflux disease)    Hypertension    Migraines    PONV (postoperative nausea and vomiting)    W/ anesthesia gas   Radiculopathy of cervical region    Stroke [facility]) 11/13/2005   TIA, no residual effects    Past Surgical History: Past  Surgical History:  Procedure Laterality Date   CHOLECYSTECTOMY N/A 08/07/2013   Procedure: LAPAROSCOPIC CHOLECYSTECTOMY;  Surgeon: Mayme Spearman, MD;  Location: [facility] OR;  Service: General;  Laterality: N/A;   CLEFT LIP REPAIR     Multiple   CLEFT PALATE REPAIR     Multiple   ROTATOR CUFF REPAIR  2010   left   TUBAL LIGATION  1998    Allergies: Allergies as of 03/20/2024 - Review Complete 03/20/2024  Allergen Reaction Noted   Other Nausea And Vomiting 08/01/2013   Latex Hives, Itching, and Rash 12/12/2012   Amoxicillin Nausea Only 12/11/2012   Contrast media [iodinated contrast media]  09/17/2018   Iohexol  10/05/2008   Doxycycline Rash 07/31/2013   Erythromycin Rash 12/11/2012    Medications:  Current Outpatient Medications:    aspirin  EC 81 MG tablet, Take 81 mg by mouth daily. Swallow whole., Disp: , Rfl:    atenolol (TENORMIN) 25 MG tablet, Take by mouth daily., Disp: , Rfl:    Cholecalciferol (VITAMIN D3) 1.25 MG (50000 UT) CAPS, Take 1 capsule by mouth once a week., Disp: , Rfl:    cyclobenzaprine (FLEXERIL) 10 MG tablet, Take 1 tablet by mouth 2 (two) times daily., Disp: , Rfl: 0   dicyclomine (BENTYL) 10 MG capsule, Take 10 mg by mouth 4 (four) times daily -  before meals and at bedtime., Disp: , Rfl:    Fremanezumab -vfrm (AJOVY ) 225  MG/1.5ML SOAJ, Inject 225 mg into the skin every 28 (twenty-eight) days., Disp: 1.68 mL, Rfl: 5   nystatin cream (MYCOSTATIN), Apply 1 Application topically 2 (two) times daily., Disp: , Rfl:    omeprazole  (PRILOSEC) 40 MG capsule, Take 40 mg by mouth daily., Disp: , Rfl:    Rimegepant Sulfate (NURTEC) 75 MG TBDP, Take 1 tablet (75 mg total) by mouth daily as needed., Disp: 8 tablet, Rfl: 5  Social History: Social History   Tobacco Use   Smoking status: Former    Current packs/day: 0.00    Average packs/day: 0.1 packs/day for 1.3 years (0.1 ttl pk-yrs)    Types: Cigarettes    Start date: 04/10/2012    Quit date: 08/07/2013    Years  since quitting: 10.6   Smokeless tobacco: Never  Vaping Use   Vaping status: Never Used  Substance Use Topics   Alcohol use: Yes    Comment: 1-2 a year   Drug use: No    Family Medical History: Family History  Problem Relation Age of Onset   Migraines Mother    Heart disease Father    Alcohol abuse Father    Heart attack Father    Migraines Maternal Aunt    Breast cancer Maternal Aunt    Depression Maternal Aunt    Migraines Maternal Aunt    Heart disease Paternal Uncle    Diabetes Paternal Uncle    Stroke Paternal Uncle    Ovarian cancer Maternal Grandmother    Heart disease Maternal Grandmother    Stroke Maternal Grandmother    Depression Maternal Grandmother    Heart attack Maternal Grandmother    Hypertension Maternal Grandmother    Heart disease Maternal Grandfather    Heart attack Maternal Grandfather    Heart disease Paternal Grandmother    Heart attack Paternal Grandmother    Diabetes Paternal Grandfather    Stroke Paternal Grandfather    Migraines Son    Migraines Son     Physical Examination: Vitals:   03/20/24 1505  BP: 126/84    General: Patient is in no apparent distress. Attention to examination is appropriate.  Neck:   Supple.  Full range of motion.  Respiratory: Patient is breathing without any difficulty.   NEUROLOGICAL:     Awake, alert, oriented to person, place, and time.  Speech is clear and fluent.   Cranial Nerves: Pupils equal round and reactive to light.  Facial tone is symmetric.  Facial sensation is symmetric. Shoulder shrug is symmetric. Tongue protrusion is midline.  There is no pronator drift.  Strength: Side Biceps Triceps Deltoid Interossei Grip Wrist Ext. Wrist Flex.  R 5 5 5 5 5 5 5   L 5 5 5 5 5 5 5    Side Iliopsoas Quads Hamstring PF DF EHL  R 5 5 5 5 5 5   L 5 5 5 5 5 5    Reflexes are 2+ and symmetric at the biceps, triceps, brachioradialis, patella and achilles.   Hoffman's is absent.   Bilateral upper and lower  extremity sensation is intact to light touch.    No evidence of dysmetria noted.  Gait is normal.     Medical Decision Making  Imaging: MRI L spine 02/17/2024 IMPRESSION: 1. L4-5: Advanced bilateral facet arthropathy allowing anterolisthesis of 4 mm. Bulging of the disc. Synovial cyst projecting inward from the facet joint on the left measuring 3 x 5 mm. Stenosis of the lateral recesses and foramina, left worse than right. Some potential for  neural compression, particularly on the left. The facet arthropathy would likely be painful. 2. L5-S1: Facet osteoarthritis with 3 mm of anterolisthesis. No stenosis of the canal or foramina. The facet arthritis could be painful, but does not show the edematous changes present at the L4-5 level.     Electronically Signed   By: Bettylou Brunner M.D.   On: 03/12/2024 08:12  I have personally reviewed the images and agree with the above interpretation.  Assessment and Plan: [name] is a pleasant 53 y.o. 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  She has back pain with left-sided sciatica.  She has implied instability as her anterolisthesis is worsened over time.  I would like to start her with physical therapy.  I will see her back in 8 weeks.  Will get lumbar spine flexion-extension x-rays at that time.   I spent a total of 30 minutes in this patient's care today. This time was spent reviewing pertinent records including imaging studies, obtaining and confirming history, performing a directed evaluation, formulating and discussing my recommendations, and documenting the visit within the medical record.     Thank you for involving me in the care of this patient.      Kaydon Creedon K. Mont Antis MD, [facility] Neurosurgery

## 2024-03-20 ENCOUNTER — Ambulatory Visit
Admission: RE | Admit: 2024-03-20 | Discharge: 2024-03-20 | Disposition: A | Discharge status: Home / Self Care | Source: Ambulatory Visit | Attending: Neurosurgery | Admitting: Neurosurgery

## 2024-03-20 ENCOUNTER — Ambulatory Visit: Admitting: Neurosurgery

## 2024-03-20 ENCOUNTER — Encounter: Payer: Self-pay | Admitting: Neurosurgery

## 2024-03-20 VITALS — BP 126/84 | Ht 61.0 in | Wt 145.0 lb

## 2024-03-20 DIAGNOSIS — M532X6 Spinal instabilities, lumbar region: Secondary | ICD-10-CM

## 2024-03-20 DIAGNOSIS — M4316 Spondylolisthesis, lumbar region: Secondary | ICD-10-CM | POA: Diagnosis present

## 2024-03-20 DIAGNOSIS — M7138 Other bursal cyst, other site: Secondary | ICD-10-CM | POA: Insufficient documentation

## 2024-03-20 DIAGNOSIS — M5442 Lumbago with sciatica, left side: Secondary | ICD-10-CM

## 2024-03-20 DIAGNOSIS — G8929 Other chronic pain: Secondary | ICD-10-CM | POA: Insufficient documentation

## 2024-03-31 ENCOUNTER — Telehealth: Payer: Self-pay | Admitting: Pharmacy Technician

## 2024-03-31 ENCOUNTER — Other Ambulatory Visit (HOSPITAL_COMMUNITY): Payer: Self-pay

## 2024-03-31 NOTE — Telephone Encounter (Signed)
 Pharmacy Patient Advocate Encounter   Received notification from Fax that prior authorization for [facility] 120MG  is required/requested.   Insurance verification completed.   The patient is insured through [facility] .   Per test claim: PA required; PA submitted to above mentioned insurance via CoverMyMeds Key/confirmation #/EOC [facility] Status is pending

## 2024-03-31 NOTE — Telephone Encounter (Signed)
 Pharmacy Patient Advocate Encounter  Received notification from [facility] that Prior Authorization for EMGALITY  120MG  has been CANCELLED due to    TEST BILLING RESULTS RETURN A $35 COPAY PER 30 DAYS

## 2024-04-08 NOTE — Telephone Encounter (Signed)
 Ajovy  PA needed not Emgality 

## 2024-04-09 ENCOUNTER — Telehealth: Payer: Self-pay | Admitting: Pharmacy Technician

## 2024-04-09 ENCOUNTER — Other Ambulatory Visit (HOSPITAL_COMMUNITY): Payer: Self-pay

## 2024-04-09 NOTE — Telephone Encounter (Signed)
 PA has been submitted, and telephone encounter has been created. Please see telephone encounter dated 5.28.25.

## 2024-04-09 NOTE — Telephone Encounter (Signed)
 Pharmacy Patient Advocate Encounter   Received notification from Pt Calls Messages that prior authorization for AJOVY  225MG  is required/requested.   Insurance verification completed.   The patient is insured through [facility] .   Per test claim: PA required; PA submitted to above mentioned insurance via CoverMyMeds Key/confirmation #/EOC [id number] Status is pending

## 2024-04-11 ENCOUNTER — Other Ambulatory Visit (HOSPITAL_COMMUNITY): Payer: Self-pay

## 2024-04-11 NOTE — Telephone Encounter (Signed)
 Pharmacy Patient Advocate Encounter  Received notification from [facility] that Prior Authorization for AJOVY  225MG  has been APPROVED from 5.28.25 to 5.28.26. Unable to obtain price due to refill too soon rejection, last fill date 5.29.25 next available fill date6.19.25   PA #/Case ID/Reference #: [id number]

## 2024-04-24 ENCOUNTER — Telehealth: Payer: Self-pay | Admitting: Neurology

## 2024-04-24 NOTE — Telephone Encounter (Signed)
 Pt called in and left a voicemail. She wants to see if she can come in and get a headache cocktail?

## 2024-04-25 ENCOUNTER — Ambulatory Visit: Admitting: Family

## 2024-04-25 ENCOUNTER — Ambulatory Visit

## 2024-04-25 NOTE — Telephone Encounter (Signed)
 Pt called she is coming in today for a headache cocktail she was informed to be here by 4 and driver has to come with her to the office

## 2024-04-29 ENCOUNTER — Ambulatory Visit

## 2024-05-05 ENCOUNTER — Telehealth: Payer: Self-pay | Admitting: Pharmacist

## 2024-05-05 ENCOUNTER — Other Ambulatory Visit (HOSPITAL_COMMUNITY): Payer: Self-pay

## 2024-05-05 NOTE — Telephone Encounter (Signed)
 Pharmacy Patient Advocate Encounter  Received notification from [facility] that Prior Authorization for NURTEC 75MG  has been DENIED.  Full denial letter will be uploaded to the media tab. See denial reason below.

## 2024-05-05 NOTE — Telephone Encounter (Signed)
 Pharmacy Patient Advocate Encounter   Received notification from Patient Pharmacy that prior authorization for Nurtec 75MG  dispersible tablets is required/requested.   Insurance verification completed.   The patient is insured through [facility] .   Per test claim: PA required; PA submitted to above mentioned insurance via CoverMyMeds Key/confirmation #/EOC [facility] Status is pending

## 2024-05-06 ENCOUNTER — Telehealth: Payer: Self-pay | Admitting: Pharmacist

## 2024-05-06 ENCOUNTER — Other Ambulatory Visit (HOSPITAL_COMMUNITY): Payer: Self-pay

## 2024-05-06 NOTE — Telephone Encounter (Signed)
 Appeal has been submitted for Nurtec. Will advise when response is received or follow up in 1 week. Please be advised that most companies may take 30 days to make a decision. Appeal letter and supporting documentation have been faxed to [phone] on 05/06/2024 @9 :40 am.  Thank you, Devere Pandy, PharmD Clinical Pharmacist  [place]  Direct Dial: [phone]

## 2024-05-06 NOTE — Telephone Encounter (Signed)
 The appeal for Nurtec has been approved by the insurance:

## 2024-05-19 NOTE — Therapy (Incomplete)
 OUTPATIENT PHYSICAL THERAPY THORACOLUMBAR EVALUATION   Patient Name: [name] MRN: [id number] DOB:[birth year], 53 y.o., female Today's Date: 05/19/2024  END OF SESSION:   Past Medical History:  Diagnosis Date   Anemia    Anxiety    w panic disorder   Chest pain    a. Felt noncardiac with normal stress echo 11/2012. per pt having episodes as recently as today 11/22/23, no cardiologist, follows with PCP D. Tetter @ [facility]   Cholelithiasis    a. on Abd US  11/2012.   Complication of anesthesia    Depression    Fibromyalgia    GERD (gastroesophageal reflux disease)    Hypertension    Migraines    PONV (postoperative nausea and vomiting)    W/ anesthesia gas   Radiculopathy of cervical region    Stroke (HCC) 11/13/2005   TIA, no residual effects   Past Surgical History:  Procedure Laterality Date   CHOLECYSTECTOMY N/A 08/07/2013   Procedure: LAPAROSCOPIC CHOLECYSTECTOMY;  Surgeon: Deward GORMAN Curvin DOUGLAS, MD;  Location: [facility] OR;  Service: General;  Laterality: N/A;   CLEFT LIP REPAIR     Multiple   CLEFT PALATE REPAIR     Multiple   ROTATOR CUFF REPAIR  2010   left   TUBAL LIGATION  1998   Patient Active Problem List   Diagnosis Date Noted   Intractable migraine with aura with status migrainosus 11/29/2023   Bilateral carpal tunnel syndrome 03/02/2023   Chronic neck pain 03/02/2023   Genetic testing 11/10/2022   Healthcare maintenance 09/17/2018   DDD (degenerative disc disease), cervical 09/17/2018   Fibromyalgia 09/17/2018   Anxiety 09/17/2018   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  RATIONALE FOR EVALUATION AND TREATMENT: Rehabilitation  ONSET DATE: ***  NEXT MD VISIT: 06/10/2024   SUBJECTIVE:                                                                                                                                                                                                         SUBJECTIVE STATEMENT: ***  PAIN:  Are you having pain? {OPRCPAIN:27236}  PERTINENT HISTORY:  ***anxiety with panic disorder, depression, fibromyalgia, GERD, HTN, migraines, chronic neck pain, cervical DDD, cervical radiculopathy, TIA, psychogenic movement disorder, tremor  PRECAUTIONS: {Therapy precautions:24002}  RED FLAGS: {PT Red Flags:29287}  WEIGHT BEARING RESTRICTIONS: {Yes ***/No:24003}  FALLS:  Has patient fallen in last 6 months? {fallsyesno:27318}  LIVING ENVIRONMENT: Lives with: {OPRC lives with:25569::lives with their family} Lives in: {Lives in:25570} Stairs: {opstairs:27293} Has following equipment at home: {Assistive devices:23999}  OCCUPATION: ***  PLOF: {PLOF:24004}  PATIENT GOALS: ***   OBJECTIVE: (objective measures completed at initial evaluation unless otherwise dated)  DIAGNOSTIC FINDINGS:  03/20/24 - DG Lumbar Spine Complete: FINDINGS: There is no evidence of lumbar spine fracture. Grade 1 anterolisthesis of L4 on L5, L5 on S1. Mild narrow intervertebral spaces with facet joint sclerosis at L4-5 and L5-S1. The degree of anterolisthesis at L4-5 measures 1.1 cm on extension view, 0.9 cm on flexion view, 1 cm on neutral view. The degree of anterolisthesis on L5-S1 measures 7 mm on neutral view, 7 mm on flexion view, 8 mm on extension view. Prior cholecystectomy clips   IMPRESSION: Degenerative joint changes of lower lumbar spine.  02/17/24 - MR Lumbar Spine: FINDINGS: Segmentation:  5 lumbar type vertebral  bodies.   Alignment: Degenerative anterolisthesis at L4-5 and L5-S1 of 3-4 mm.   Vertebrae: No vertebral body lesion. Edematous changes of the facet arthropathy at L4-5.   Conus medullaris and cauda equina: Conus extends to the L1 level. Conus and cauda equina appear normal.   Paraspinal and other soft tissues: Negative   Disc levels:   No abnormality at L2-3 or above.   L3-4: No disc abnormality.  Minimal facet hypertrophy.  No stenosis.   L4-5: Advanced bilateral facet arthropathy allowing anterolisthesis of 4 mm. Bulging of the disc. Synovial cyst projecting inward from the facet joint on the left measuring 3 x 5 mm. Stenosis of the lateral recesses and foramina, left worse than right. Some potential for neural compression, particularly on the left. The facet arthropathy would likely be painful.   L5-S1: Facet osteoarthritis with degenerative anterolisthesis of 3 mm. No disc bulge or herniation. No stenosis of the canal or foramina. The facet arthritis could be painful, but does not show the edematous changes present at the L4-5 level.   IMPRESSION: 1. L4-5: Advanced bilateral facet arthropathy allowing anterolisthesis of 4 mm. Bulging of the disc. Synovial cyst projecting inward from the facet joint on the left measuring 3 x 5 mm. Stenosis of the lateral recesses and foramina, left worse than right. Some potential for neural compression, particularly on the left. The facet arthropathy would likely be painful. 2. L5-S1: Facet osteoarthritis with 3 mm of anterolisthesis. No stenosis of the canal or foramina. The facet arthritis could be painful, but does not show the edematous changes present at the L4-5 level.  PATIENT SURVEYS:  Modified Oswestry: ***  Interpretation of scores: Score Category Description  0-20% Minimal Disability The patient can cope with most living activities. Usually no treatment is indicated apart from advice on lifting, sitting and exercise  21-40% Moderate  Disability The patient experiences more pain and difficulty with sitting, lifting and standing. Travel and social life are more difficult and they may be disabled from work. Personal care, sexual activity and sleeping are not grossly affected, and the patient can usually be managed by conservative means  41-60% Severe Disability Pain remains the main problem in this group, but activities of daily living are affected. These  patients require a detailed investigation  61-80% Crippled Back pain impinges on all aspects of the patient's life. Positive intervention is required  81-100% Bed-bound  These patients are either bed-bound or exaggerating their symptoms  Bluford FORBES Zoe DELENA Karon DELENA, et al. Surgery versus conservative management of stable thoracolumbar fracture: the PRESTO feasibility RCT. [place] ([place]): [employer]; 2021 Nov. [facility] Technology Assessment, No. 25.62.) Appendix 3, Oswestry Disability Index category descriptors. Available from: FindJewel[employer].cz  Minimally Clinically Important Difference (MCID) = 12.8%  SCREENING FOR RED FLAGS: Bowel or bladder incontinence: {Yes/No:304960894} Spinal tumors: {Yes/No:304960894} Cauda equina syndrome: {Yes/No:304960894} Compression fracture: {Yes/No:304960894} Abdominal aneurysm: {Yes/No:304960894}  COGNITION:  Overall cognitive status: {cognition:24006}    SENSATION: {sensation:27233}  POSTURE:  {posture:25561}  PALPATION: ***  LUMBAR ROM:   {AROM/PROM:27142}  Eval  Flexion   Extension   Right lateral flexion   Left lateral flexion   Right rotation   Left rotation   (Blank rows = not tested)  MUSCLE LENGTH: Hamstrings: Right *** deg; Left *** deg Thomas test: Right *** deg; Left *** deg Hamstrings: *** ITB: *** Piriformis: *** Hip flexors: *** Quads: *** Heelcord: ***  LOWER EXTREMITY ROM:     {AROM/PROM:27142}  Right eval Left eval  Hip flexion    Hip extension    Hip  abduction    Hip adduction    Hip internal rotation    Hip external rotation    Knee flexion    Knee extension    Ankle dorsiflexion    Ankle plantarflexion    Ankle inversion    Ankle eversion    (Blank rows = not tested)  LOWER EXTREMITY MMT:    MMT Right eval Left eval  Hip flexion    Hip extension    Hip abduction    Hip adduction    Hip internal rotation    Hip external rotation    Knee flexion    Knee extension    Ankle dorsiflexion    Ankle plantarflexion    Ankle inversion    Ankle eversion     (Blank rows = not tested)  LUMBAR SPECIAL TESTS:  {lumbar special test:25242}  FUNCTIONAL TESTS:  {Functional tests:24029}  GAIT: Distance walked: *** Assistive device utilized: {Assistive devices:23999} Level of assistance: {Levels of assistance:24026} Gait pattern: {gait characteristics:25376} Comments: ***   TODAY'S TREATMENT:   ***   PATIENT EDUCATION:  Education details: {Education details:27468}  Person educated: {Person educated:25204} Education method: {Education Method [employer] Education comprehension: {Education Comprehension:25206}  HOME EXERCISE PROGRAM: ***   ASSESSMENT:  CLINICAL IMPRESSION: [name] is a 53 y.o. female who was referred to physical therapy for evaluation and treatment for ***.  ***   Patient reports onset of *** pain beginning ***. Pain is worse with ***.  Patient has deficits in *** ROM, *** LE flexibility, *** strength, abnormal posture, and TTP with abnormal muscle tension *** which are interfering with ADLs and are impacting quality of life.  On Modified Oswestry patient scored ***/50 demonstrating ***% or *** disability.  Candie will benefit from skilled PT to address above deficits to improve mobility and activity tolerance with decreased pain interference.   ***  OBJECTIVE IMPAIRMENTS: {opptimpairments:25111}.   ACTIVITY LIMITATIONS: {activitylimitations:27494}  PARTICIPATION LIMITATIONS:  {participationrestrictions:25113}  PERSONAL FACTORS: {Personal factors:25162} are also affecting patient's functional outcome.   REHAB POTENTIAL: {rehabpotential:25112}  CLINICAL DECISION MAKING: {clinical decision making:25114}  EVALUATION COMPLEXITY: {Evaluation complexity:25115}   GOALS: Goals reviewed with patient? {yes/no:20286}  SHORT TERM GOALS: Target date: ***  Patient will be independent with initial HEP to improve outcomes and carryover.  Baseline: *** Goal status: {GOALSTATUS:25110}  2.  Patient will report 25% improvement in low back pain to improve QOL. Baseline: *** Goal status: {GOALSTATUS:25110}  3.  *** Baseline:  Goal status: {GOALSTATUS:25110}   LONG TERM GOALS: Target date: ***  Patient will be independent with ongoing/advanced HEP for self-management at home.  Baseline: *** Goal status: {GOALSTATUS:25110}  2.  Patient will report 50-75% improvement in low back pain to improve QOL.  Baseline: *** Goal status: {GOALSTATUS:25110}  3.  Patient to demonstrate ability to achieve and maintain good spinal alignment/posturing and body mechanics needed for daily activities. Baseline: *** Goal status: {GOALSTATUS:25110}  4.  Patient will demonstrate full pain free lumbar ROM to perform ADLs.   Baseline: Refer to above lumbar ROM table Goal status: {GOALSTATUS:25110}  5.  Patient will demonstrate improved *** strength to >/= ***/5 for improved stability and ease of mobility. Baseline: Refer to above LE MMT table Goal status: {GOALSTATUS:25110}  6. Patient will report </= ***% on Modified Oswestry (MCID = 12%) to demonstrate improved functional ability with decreased pain interference. Baseline: *** Goal status: {GOALSTATUS:25110}  7.  Patient will tolerate *** min of (standing/sitting/walking) w/o increased pain to allow for *** improved mobility and activity tolerance. Baseline: *** Goal status: {GOALSTATUS:25110}  8.  Patient will report  centralization of radicular symptoms.  Baseline: *** Goal status: {GOALSTATUS:25110}  9.  Patient will ***.  Baseline: *** Goal status: {GOALSTATUS:25110}   10.  *** Baseline: *** Goal status: {GOALSTATUS:25110}    PLAN:  PT FREQUENCY: {rehab frequency:25116}  PT DURATION: {rehab duration:25117}  PLANNED INTERVENTIONS: {rehab planned interventions:25118::97110-Therapeutic exercises,97530- Therapeutic [phone]- Neuromuscular re-education,97535- Self Rjmz,02859- Manual therapy}  PLAN FOR NEXT SESSION: ***   Elijah CHRISTELLA Hidden, PT 05/19/2024, 12:55 PM

## 2024-05-26 ENCOUNTER — Ambulatory Visit: Admitting: Physical Therapy

## 2024-05-29 ENCOUNTER — Ambulatory Visit: Admitting: Neurosurgery

## 2024-06-03 NOTE — Progress Notes (Unsigned)
 NEUROLOGY FOLLOW UP OFFICE NOTE  [name] [id number]  Assessment/Plan:   Migraine with aura, without status migrainosus, not intractable Lumbar spondylosis with radiculopathy Bilateral upper extremity pain, numbness and weakness.  Etiology unclear.   Migraine prevention:  Ajovy  Migraine rescue:  Nurtec once daily as needed Limit use of pain relievers to no more than 9 days out of the month to prevent risk of rebound or medication-overuse headache. Keep headache diary Follow up with neurosurgery regarding sciatrica. Follow up 8 months.       Subjective:  [name] is a 53 year old female with HTN, anxiety, and history of psychogenic movements who follows up for migraines and numbness.  MRI of lumbar spine personally reviewed.  UPDATE: Migraines: Plan was to start Vyepti but insurance required her to try and fail all 3 of the monthly injections.  Started Ajovy . *** Duration:  within 2 hours with Nurtec (on a couple of occasions, ineffective and had to repeat dose next day.  Frequency:  15 days a month.    Current NSAIDS/analgesics:  Goodys, Excedrin Migraine, Tylenol , ibuprofen, ASA, celecoxib 1000mg  daily Current triptans:  none Current ergotamine:  none Current anti-emetic:  none Current muscle relaxants:  cyclobenzaprine 10mg  TID Current Antihypertensive medications:  atenolol 25mg  daily Current Antidepressant medications:  none Current Anticonvulsant medications:  none Current anti-CGRP:  Nurtec PRN, Ajovy  Current Vitamins/Herbal/Supplements:  none Current Antihistamines/Decongestants:  meclizine Other therapy:  none Birth control:  none     Caffeine:  no Diet:  over 100 oz daily.  Does not skip meals. Exercise:  yoga daily Depression:  no; Anxiety:  yes Sleep hygiene:  poor.  Trouble falling asleep and staying asleep. On average, 4-5 hours of sleep per night.  Sometimes cannot sleep at all.  Ambien  caused side effects.  Bilateral lower extremity  pain, weakness and numbness: 02/17/2024 MRI L-SPINE WO:  1. L4-5: Advanced bilateral facet arthropathy allowing anterolisthesis of 4 mm. Bulging of the disc. Synovial cyst projecting inward from the facet joint on the left measuring 3 x 5mm. Stenosis of the lateral recesses and foramina, left worse than right. Some potential for neural compression, particularly on the left. The facet arthropathy would likely be painful. 2. L5-S1: Facet osteoarthritis with 3 mm of anterolisthesis. No stenosis of the canal or foramina. The facet arthritis could be painful, but does not show the edematous changes present at the L4-5 level.  Referred to neurosurgery, Dr. Katrina.  Recommended first PT.  ***    HISTORY: Migraines: Onset:  53 years old. Location:  diffuse Quality:  pressure, stabbing Intensity:  severe Aura:  halos, shadows, spots,  Prodrome:  absent Associated symptoms:  nausea, sometimes vomiting, photophobia, phonophobia, .  She denies associated unilateral numbness or weakness.  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 Activity:  aggravates.  Past NSAIDS/analgesics:  naproxen Past abortive triptans:  sumatriptan  tab, rizatriptan Past abortive ergotamine:  none Past muscle relaxants:  none Past anti-emetic:  promethazine  Past antihypertensive medications:  valsartan, losartan Past antidepressant medications:  venlafaxine Past anticonvulsant medications:  topiramate Past anti-CGRP:  Emgality  Past vitamins/Herbal/Supplements:  none Past antihistamines/decongestants:  Benadryl  Other past therapies:  Botox  Family history of headache:  mother, maternal aunts   Bilateral upper and lower extremity pain, weakness and numbness: She reports bilateral  hand  pain and numbness since 2018.  It would always wake her up at night.  She reports cramping and sharp pain, sometimes hands (thumbs in particular) lock up.  Entire hand is numb radiating up to elbows.  Elbows hurt.  Upper arms cramp.  Shoulders hurt.  Due to numbness, she cannot feel the heat, whether using the stove or hot water.  She has NCV-EMG in March at [facility] that reportedly showed just carpal tunnel syndrome.  She saw orthopedics who performed steroid injections into the carpal tunnels which did not work.  PT for neck pain ineffective.  She has history of chronic neck pain with radicular symptoms as well as fibromyalgia.  She started experiencing pain and numbness in the lower extremities in 2022.  In September 2024, developed bilateral lower back pain radiating down both legs.  No preceding fall or injury.  Started after she rolled over in bed.  No saddle anesthesia, loss of bowel or bladder control, numbness or weakness of the legs.  Seen in the ED after one week of symptoms on 9/20.  Treated with 3-day course of prednisone .  Physical therapy ineffective.   10/11/2016 MRI BRAIN W WO:  1.   Single T2/FLAIR hyperintense focus in the white matter of the right frontal lobe that was also seen on the 2010 MRI.    This is unlikely to be of clinical significance and likely represents a small chronic microvascular ischemic focus.  2.    There are no acute findings and there is a normal enhancement pattern. 10/13/2016 MRI C-SPINE WO:  1. No evidence of  10/13/2016 MRI C-SPINE WO:  1. No evidence of myelopathy.  2. Upper cervical facet arthropathy and mid cervical disc degeneration as described.  3. 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 C5-6 moderate left foraminal narrowing from uncovertebral spur.  4. 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 01/24/2023 NCV-EMG UPPER EXTREMITIES:  1. 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  2.  Mild  left median sensory nerve focal, demyelinating mononeuropathy at or near the wrist. 05/09/2023 MRI C-SPINE:  1. Multilevel cervical spondylosis with resultant mild diffuse spinal stenosis at C4-5 through C6-7. 2. Multifactorial degenerative changes with resultant multilevel foraminal narrowing as above. Notable findings include mild to moderate right C5 foraminal stenosis, moderate left C6 foraminal narrowing, with mild left C7 foraminal stenosis. 3. 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 05/28/2023 NCV-EMG LOWER EXTREMITIES:  This is a normal study.  In particular, there is no electrodiagnostic evidence of a right or left lumbosacral (L3-S1) radiculopathy, large fiber sensorimotor neuropathy, or myopathy. 5/29-7/26/2024 LABS:  B12 345, TSH 1.54, IFE normal, negative ANA, negative ENA panel.   PAST MEDICAL HISTORY: Past Medical History:  Diagnosis Date   Anemia    Anxiety    w panic disorder   Chest pain    a. Felt noncardiac with normal stress echo 11/2012. per pt having episodes as recently as today 11/22/23, no cardiologist, follows with PCP D. Tetter @ [place]   Cholelithiasis    a. on Abd US  11/2012.   Complication of anesthesia    Depression    Fibromyalgia    GERD (gastroesophageal reflux disease)    Hypertension    Migraines    PONV (postoperative nausea and vomiting)    W/ anesthesia gas   Radiculopathy of cervical region    Stroke (HCC) 11/13/2005   TIA, no residual effects    MEDICATIONS: Current Outpatient Medications on  File Prior to Visit  Medication Sig Dispense Refill   aspirin  EC 81 MG tablet Take 81 mg by mouth daily. Swallow whole.     atenolol (TENORMIN) 25 MG tablet Take by mouth daily.     Cholecalciferol (VITAMIN D3) 1.25 MG (50000 UT) CAPS Take 1 capsule by mouth once a week.     cyclobenzaprine (FLEXERIL) 10 MG tablet Take 1 tablet by mouth 2 (two) times daily.  0   dicyclomine (BENTYL) 10 MG capsule Take 10 mg by mouth 4 (four) times daily -  before  meals and at bedtime.     Fremanezumab -vfrm (AJOVY ) 225 MG/1.5ML SOAJ Inject 225 mg into the skin every 28 (twenty-eight) days. 1.68 mL 5   nystatin cream (MYCOSTATIN) Apply 1 Application topically 2 (two) times daily.     omeprazole  (PRILOSEC) 40 MG capsule Take 40 mg by mouth daily.     Rimegepant Sulfate (NURTEC) 75 MG TBDP Take 1 tablet (75 mg total) by mouth daily as needed. 8 tablet 5   No current facility-administered medications on file prior to visit.     ALLERGIES: Allergies  Allergen Reactions   Other Nausea And Vomiting    sleeping gas= makes me vomit  (even when a sleep)   Latex Hives, Itching and Rash   Amoxicillin Nausea Only    Pill form only, ok with liquid   Contrast Media [Iodinated Contrast Media]     CT   Iohexol      Desc: Pt had throat tightness, slight swelling. Itchy eyes and itchy gums.  Dr Minus spoke w/ pt.  Treating this like possible/early reaction.  Pt was counseled. Needs full premeds w/ IV contrast, txs., Onset Date: [id number]    Doxycycline Rash   Erythromycin Rash    FAMILY HISTORY: Family History  Problem Relation Age of Onset   Migraines Mother    Heart disease Father    Alcohol abuse Father    Heart attack Father    Migraines Maternal Aunt    Breast cancer Maternal Aunt    Depression Maternal Aunt    Migraines Maternal Aunt    Heart disease Paternal Uncle    Diabetes Paternal Uncle    Stroke Paternal Uncle    Ovarian cancer Maternal Grandmother    Heart disease Maternal Grandmother    Stroke Maternal Grandmother    Depression Maternal Grandmother    Heart attack Maternal Grandmother    Hypertension Maternal Grandmother    Heart disease Maternal Grandfather    Heart attack Maternal Grandfather    Heart disease Paternal Grandmother    Heart attack Paternal Grandmother    Diabetes Paternal Grandfather    Stroke Paternal Grandfather    Migraines Son    Migraines Son       Objective:  *** General: No acute distress.   Patient appears well-groomed.   Head:  Normocephalic/atraumatic Neck:  Supple.  No paraspinal tenderness.  Full range of motion. Heart:  Regular rate and rhythm. Neuro:  Alert and oriented.  Speech fluent and not dysarthric.  Language intact.  CN II-XII intact.  Bulk and tone normal.  Muscle strength 5/5 throughout.  Sensation to light touch intact.  Deep tendon reflexes 2+ throughout, toes downgoing.  Gait normal.  Romberg negative.   Juliene Dunnings, DO  CC: Devin Tetter, NP

## 2024-06-04 ENCOUNTER — Ambulatory Visit: Admitting: Neurology

## 2024-06-04 ENCOUNTER — Telehealth (HOSPITAL_COMMUNITY): Payer: Self-pay

## 2024-06-04 ENCOUNTER — Encounter: Payer: Self-pay | Admitting: Neurology

## 2024-06-04 VITALS — BP 114/72 | HR 71 | Ht 64.0 in | Wt 134.0 lb

## 2024-06-04 DIAGNOSIS — M5416 Radiculopathy, lumbar region: Secondary | ICD-10-CM

## 2024-06-04 DIAGNOSIS — G43109 Migraine with aura, not intractable, without status migrainosus: Secondary | ICD-10-CM

## 2024-06-04 DIAGNOSIS — G43009 Migraine without aura, not intractable, without status migrainosus: Secondary | ICD-10-CM | POA: Insufficient documentation

## 2024-06-04 MED ORDER — SUMATRIPTAN SUCCINATE 6 MG/0.5ML ~~LOC~~ SOLN: 6.0000 mg | SUBCUTANEOUS | 5 refills | Status: AC | PRN

## 2024-06-04 NOTE — Telephone Encounter (Addendum)
 Auth Submission:Denied Site of care: Site of care: [facility] Payer: [employer]BS Medication & CPT/J Code(s) submitted: Vyepti (Eptinezumab) [id number] Diagnosis Code: G43.109 Route of submission (phone, fax, portal): fax Phone # Fax #[phone] Auth type: Buy/Bill PB Units/visits requested: 300mg  q 3 months Reference number: [id number]  Denied due to criteria not met. Will upload denial letter to onbase once received.

## 2024-06-04 NOTE — Patient Instructions (Signed)
 Plan to change from Ajovy  to Vyepti 300mg  IV every 3 months Nurtec once daily as needed If you wake up with migraine, take sumatriptan  injection.  May repeat after 1 hour.  Maximum 2 injections in 24 hours. Follow up 7 months.

## 2024-06-04 NOTE — Progress Notes (Signed)
 Medication Samples have been provided to the patient.  Drug name: Nurtec       Strength: 75 mg        Qty: 2  LOT: [id number]  Exp.Date: 7/28  Dosing instructions: as needed  The patient has been instructed regarding the correct time, dose, and frequency of taking this medication, including desired effects and most common side effects.   [name] 3:05 PM 06/04/2024

## 2024-06-06 NOTE — Therapy (Addendum)
 OUTPATIENT PHYSICAL THERAPY THORACOLUMBAR EVALUATION / DISCHARGE SUMMARY   Patient Name: [name] MRN: [id number] DOB:[birth year], 53 y.o., female Today's Date: 06/09/2024  END OF SESSION:  PT End of Session - 06/09/24 1128     Visit Number 1    Date for PT Re-Evaluation 08/04/24    Authorization Type [employer]    Authorization Time Period auth pending    Authorization - Visit Number 1    PT Start Time 1128    PT Stop Time 1227    PT Time Calculation (min) 59 min    Activity Tolerance Patient tolerated treatment well    Behavior During Therapy WFL for tasks assessed/performed          Past Medical History:  Diagnosis Date   Anemia    Anxiety    w panic disorder   Chest pain    a. Felt noncardiac with normal stress echo 11/2012. per pt having episodes as recently as today 11/22/23, no cardiologist, follows with PCP D. Tetter @ [facility]   Cholelithiasis    a. on Abd US  11/2012.   Complication of anesthesia    Depression    Fibromyalgia    GERD (gastroesophageal reflux disease)    Hypertension    Migraines    PONV (postoperative nausea and vomiting)    W/ anesthesia gas   Radiculopathy of cervical region    Stroke (HCC) 11/13/2005   TIA, no residual effects   Past Surgical History:  Procedure Laterality Date   CHOLECYSTECTOMY N/A 08/07/2013   Procedure: LAPAROSCOPIC CHOLECYSTECTOMY;  Surgeon: Deward GORMAN Curvin DOUGLAS, MD;  Location: [facility] OR;  Service: General;  Laterality: N/A;   CLEFT LIP REPAIR     Multiple   CLEFT PALATE REPAIR     Multiple   ROTATOR CUFF REPAIR  2010   left   TUBAL LIGATION  1998   Patient Active Problem List   Diagnosis Date Noted   Migraine without aura, not intractable, without status migrainosus 06/04/2024   Intractable migraine with aura with status migrainosus 11/29/2023   Bilateral carpal tunnel syndrome 03/02/2023   Chronic neck pain 03/02/2023   Genetic testing 11/10/2022   Healthcare maintenance 09/17/2018   DDD (degenerative disc  disease), cervical 09/17/2018   Fibromyalgia 09/17/2018   Anxiety 09/17/2018   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   Tremor 09/26/2016   Chronic constipation 02/03/2014   Arthritis 02/03/2014   Tobacco use 12/11/2012   Migraine with aura, not intractable, without status migrainosus 01/10/2007    PCP: Nestor Elston NOVAK, NP   REFERRING PROVIDER: Clois Fret, MD   REFERRING DIAG:  M43.16 (ICD-10-CM) - Spondylolisthesis of lumbar region  M71.38 (ICD-10-CM) - Synovial cyst of lumbar facet joint  M53.2X6 (ICD-10-CM) - Spinal instability, lumbar  G89.29,M54.42 (ICD-10-CM) - Chronic bilateral low back pain with left-sided sciatica   THERAPY DIAG:  Other low back pain  Muscle weakness (generalized)  Cramp and spasm  Pain of both hip joints  Radiculopathy, lumbar region  RATIONALE FOR EVALUATION AND TREATMENT: Rehabilitation  ONSET DATE: ~ 6 mos  NEXT MD VISIT: 06/10/2024   SUBJECTIVE:  SUBJECTIVE STATEMENT:  Pt reports that her lower back pain has been present since the birth of her last child (27 yrs). Pain goes from her lower back, into B buttocks and LE more recently (~16months for her L, 3 months R) Reports some N/T in feet as well.   PAIN: Are you having pain? Yes: NPRS scale: 7/10 Pain location: B buttock, into legs/feet  Pain description: cramp-like (best she could think of)  Aggravating factors: walking, bending, standing, lifting,sitting too long   Relieving factors: laying down in bed, hot showers   PERTINENT HISTORY:  anxiety with panic disorder, depression, fibromyalgia, GERD, HTN, migraines, chronic neck pain, cervical DDD, cervical radiculopathy, TIA, psychogenic movement disorder, tremor, R hip surgery  PRECAUTIONS: None  RED FLAGS: None  WEIGHT  BEARING RESTRICTIONS: No  FALLS:  Has patient fallen in last 6 months? 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 Grade 1 anterolisthesis of L4 on L5, L5 on S1. Mild narrow intervertebral spaces with facet joint sclerosis at L4-5 and L5-S1. The degree of anterolisthesis at L4-5 measures 1.1 cm on extension view, 0.9 cm on flexion view, 1 cm on neutral view. The degree of anterolisthesis on L5-S1 measures 7 mm on neutral view, 7 mm on flexion view, 8 mm on extension view. Prior cholecystectomy clips   IMPRESSION: Degenerative joint changes of lower lumbar spine.  02/17/24 - MR Lumbar Spine: FINDINGS: Segmentation:  5 lumbar type vertebral bodies.   Alignment: Degenerative anterolisthesis at L4-5 and L5-S1 of 3-4 mm.   Vertebrae: No vertebral body lesion. Edematous changes of the facet arthropathy at L4-5.   Conus medullaris and cauda equina: Conus extends to the L1 level. Conus and cauda equina appear normal.   Paraspinal and other soft tissues: Negative   Disc levels:   No abnormality at L2-3 or above.   L3-4: No disc abnormality.  Minimal facet hypertrophy.  No stenosis.   L4-5: Advanced bilateral facet arthropathy allowing anterolisthesis of 4 mm. Bulging of the disc. Synovial cyst projecting inward from the facet joint on the left measuring 3 x 5 mm. Stenosis of the lateral recesses and  foramina, left worse than right. Some potential for neural compression, particularly on the left. The facet arthropathy would likely be painful.   L5-S1: Facet osteoarthritis with degenerative anterolisthesis of 3 mm. No disc bulge or herniation. No stenosis of the canal or foramina. The facet arthritis could be painful, but does not show the edematous changes present at the L4-5 level.   IMPRESSION: 1. L4-5: Advanced bilateral facet arthropathy allowing anterolisthesis of 4 mm. Bulging of the disc. Synovial cyst projecting inward from the facet joint on the left measuring 3 x 5 mm. Stenosis of the lateral recesses and foramina, left worse than right. Some potential for neural compression, particularly on the left. The facet arthropathy would likely be painful. 2. L5-S1: Facet osteoarthritis with 3 mm of anterolisthesis. No stenosis of the canal or foramina. The facet arthritis could  be painful, but does not show the edematous changes present at the L4-5 level.  PATIENT SURVEYS:  Modified Oswestry: 26/50=52% (severe disability)   Interpretation of scores: Score Category Description  0-20% Minimal Disability The patient can cope with most living activities. Usually no treatment is indicated apart from advice on lifting, sitting and exercise  21-40% Moderate Disability The patient experiences more pain and difficulty with sitting, lifting and standing. Travel and social life are more difficult and they may be disabled from work. Personal care, sexual activity and sleeping are not grossly affected, and the patient can usually be managed by conservative means  41-60% Severe Disability Pain remains the main problem in this group, but activities of daily living are affected. These patients require a detailed investigation  61-80% Crippled Back pain impinges on all aspects of the patient's life. Positive intervention is required  81-100% Bed-bound  These patients are either bed-bound or exaggerating their  symptoms  Bluford FORBES Zoe DELENA Karon DELENA, et al. Surgery versus conservative management of stable thoracolumbar fracture: the PRESTO feasibility RCT. [place] ([place]): [employer]; 2021 Nov. [facility] Technology Assessment, No. 25.62.) Appendix 3, Oswestry Disability Index category descriptors. Available from: Findjewelers.cz  Minimally Clinically Important Difference (MCID) = 12.8%   COGNITION:  Overall cognitive status: Within functional limits for tasks assessed    SENSATION: Not tested  POSTURE:  rounded shoulders, forward head, weight shift right, and R lateral shift    PALPATION: TTP at L>R sacrum, L PSIS, B glutes and piriformis, B greater trochanter, B ITB NTTP in lumbar paraspinals  LUMBAR ROM:   Active  Eval  Flexion WFL, p!  Extension WFL, p!  Right lateral flexion Limited 25% R hand to above fib  Left lateral flexion Limited 25% L hand to above fib p!  Right rotation WFL p!  Left rotation Limited 25% p!, more stiff  (Blank rows = not tested)  MUSCLE LENGTH: Hamstrings: mild tight R, mod tight L  LOWER EXTREMITY ROM:    To be assessed on future visit Active  Right eval Left eval  Hip flexion    Hip extension    Hip abduction    Hip adduction    Hip internal rotation    Hip external rotation    Knee flexion    Knee extension    Ankle dorsiflexion    Ankle plantarflexion    Ankle inversion    Ankle eversion    (Blank rows = not tested)  LOWER EXTREMITY MMT:    MMT Right eval Left eval  Hip flexion 4- 4-  Hip extension 3+ 4-  Hip abduction 4+ 4-  Hip adduction 4- 4+  Hip internal rotation    Hip external rotation 4 4  Knee flexion 4- 4-  Knee extension 4+ 4-  Ankle dorsiflexion 4- 4+  Ankle plantarflexion    Ankle inversion    Ankle eversion     (Blank rows = not tested)  LUMBAR SPECIAL TESTS:  Slump test: Positive  FUNCTIONAL TESTS:  5 times sit to stand: 13.8 sec  GAIT: Distance walked: Clinic  Distances  Assistive device utilized: None Level of assistance: Complete Independence Gait pattern: lateral lean- Right Comments: noted R lateral lean in both standing and gait    TODAY'S TREATMENT:   06/09/24 SELF CARE:  Reviewed eval findings and role of PT in addressing identified deficits as well as instruction in initial HEP (see below).    PATIENT EDUCATION:  Education details: PT eval findings, anticipated POC,  and initial HEP  Person educated: Patient Education method: Explanation, Demonstration, Verbal cues, and MedBridgeGO app access provided Education comprehension: verbalized understanding and returned demonstration  HOME EXERCISE PROGRAM: Access Code: [email or web address] URL: [email or web address] Date: 06/09/2024 Prepared by: Eusebio Saba  Exercises - Seated Hamstring Stretch  - 1 x daily - 7 x weekly - 3 reps - 30 sec hold  - Seated Figure 4 Piriformis Stretch  - 1 x daily - 7 x weekly - 3 reps - 30 sec hold  - Sit to Stand  - 1 x daily - 7 x weekly - 3 sets - 5 reps  - Supine Lower Trunk Rotation  - 1 x daily - 7 x weekly - 3 sets - 10 reps - 5secs hold      ASSESSMENT:  CLINICAL IMPRESSION: [name] is a 53 y.o. female who was referred to physical therapy for evaluation and treatment for low back pain.    Patient reports onset of back pain beginning after her latest child delivery (27 yrs ago) but, more recent bouts of worsening pain have been going on for roughly 6 months. Pain is worse with activities such as standing, sitting for longer periods, walking, lifting. Pt reports that her pain is located in her low back, buttock, and at times goes into B feet (often N/T). Pt also reports that she does have problems with her balance and that she has come close to falling but, has not fully fallen or lost balance. Patient has deficits in L>R LE flexibility, B LE strength, abnormal posture (R lateral shift in standing), lumbar AROM and TTP at B sacrum  (L>R), L PSIS, B glutes, B piriformis (L>R), B greater trochanters, and ITBs which are interfering with ADLs and are impacting quality of life. Pt had a positive test for the lumbar slump test today for radicular pain reporting that the pain went from her lower back to her L foot > R foot. On Modified Oswestry patient scored 26/50 demonstrating 52% or severe disability.  [name] will benefit from skilled PT to address above deficits to improve mobility and activity tolerance with decreased pain interference.     OBJECTIVE IMPAIRMENTS: Abnormal gait, decreased activity tolerance, decreased balance, decreased endurance, decreased knowledge of condition, decreased mobility, difficulty walking, decreased strength, increased fascial restrictions, increased muscle spasms, impaired flexibility, impaired sensation, improper body mechanics, postural dysfunction, and pain.   ACTIVITY LIMITATIONS: carrying, lifting, bending, sitting, standing, squatting, stairs, bathing, dressing, and locomotion level  PARTICIPATION LIMITATIONS: meal prep, cleaning, laundry, driving, shopping, community activity, and occupation  PERSONAL FACTORS: Age, Profession, and 3+ comorbidities: fibromyalgia, GERD, HTN, migraines, chronic neck pain, anxiety, depression are also affecting patient's functional outcome.   REHAB POTENTIAL: Good  CLINICAL DECISION MAKING: Evolving/moderate complexity  EVALUATION COMPLEXITY: Moderate   GOALS: Goals reviewed with patient? Yes  SHORT TERM GOALS: Target date: 07/07/2024    Patient will be independent with initial HEP to improve outcomes and carryover.  Baseline: Initial HEP given Goal status: INITIAL  2.  Patient will report 25% improvement in low back pain to improve QOL. Baseline: 7/10 Goal status: INITIAL  3.  Patient will increase B LE flexibility of the HS to min to mild tightness to improve activity tolerance for standing activities. Baseline: R HS: mild tightness, L: mod  tightness  Goal status: INITIAL  4.  Patient will complete standardized balance assessment with FGA and update LTGs as needed.  Baseline: TBA Goal status: INITIAL   LONG TERM GOALS: Target date: 08/04/2024  Patient will be independent with ongoing/advanced HEP for self-management at home.  Baseline: Initial HEP Goal status: INITIAL  2.  Patient will decrease Modified Oswestry Low Back Pain score to < 40% to increase function in ADLs and improve activity tolerance.  Baseline: Mod Oswestry=52%  Goal status: INITIAL  3.  Patient will demonstrate full pain free lumbar ROM to perform ADLs.   Baseline: Refer to above lumbar ROM table Goal status: INITIAL  4.  Patient will demonstrate improved BLE strength to >/= 4+/5 for improved stability and ease of mobility. Baseline: Refer to above LE MMT table Goal status: INITIAL 5.  Patient will demonstrate >/ 4 pt improvement in FGA score to improve balance and reduced fall risk.  Baseline: TBA Goal status: INITIAL    PLAN:  PT FREQUENCY: 1x/week  PT DURATION: 8 weeks  PLANNED INTERVENTIONS: 02835- PT Re-evaluation, 97750- Physical Performance Testing, 97110-Therapeutic exercises, 97530- Therapeutic activity, 97112- Neuromuscular re-education, 97535- Self Care, 02859- Manual therapy, [phone]- Gait training, [phone]- Aquatic Therapy, [phone]- Ultrasound, [phone] (1-2 muscles), 20561 (3+ muscles)- Dry Needling, Patient/Family education, Balance training, Stair training, Taping, Joint mobilization, Spinal mobilization, Cryotherapy, and Moist heat  PLAN FOR NEXT SESSION:  Review/Revise initial HEP, begin LE strengthening and flexibility exercises, assess balance related tasks (FGA)   Hasini Peachey Jerrye, Student-PT 06/09/2024, 6:16 PM    PHYSICAL THERAPY DISCHARGE SUMMARY  Visits from Start of Care: 1  Current functional level related to goals / functional outcomes: Refer to above clinical impression and goal assessment for status as of eval visit  on 06/09/2024. Patient cancelled all scheduled treatment visits and did not return to PT in >30 days, therefore will proceed with discharge from PT for this episode.     Remaining deficits: As above. Unable to formally assess status at discharge due to failure to return to PT.    Education / Equipment: Initial HEP   Patient agrees to discharge. Patient goals were not met. Patient is being discharged due to not returning since the last visit.  Elijah EMERSON [name], PT 10/21/2024, 2:19 PM  [facility] [address]  [address] [place], [place], [address] Phone: [phone]   Fax:  [phone]

## 2024-06-09 ENCOUNTER — Ambulatory Visit: Attending: Neurosurgery | Admitting: Physical Therapy

## 2024-06-09 ENCOUNTER — Other Ambulatory Visit: Payer: Self-pay

## 2024-06-09 ENCOUNTER — Encounter: Payer: Self-pay | Admitting: Physical Therapy

## 2024-06-09 DIAGNOSIS — M532X6 Spinal instabilities, lumbar region: Secondary | ICD-10-CM | POA: Diagnosis not present

## 2024-06-09 DIAGNOSIS — M5416 Radiculopathy, lumbar region: Secondary | ICD-10-CM | POA: Insufficient documentation

## 2024-06-09 DIAGNOSIS — G8929 Other chronic pain: Secondary | ICD-10-CM | POA: Diagnosis not present

## 2024-06-09 DIAGNOSIS — M4316 Spondylolisthesis, lumbar region: Secondary | ICD-10-CM | POA: Diagnosis not present

## 2024-06-09 DIAGNOSIS — M25551 Pain in right hip: Secondary | ICD-10-CM | POA: Diagnosis present

## 2024-06-09 DIAGNOSIS — M5459 Other low back pain: Secondary | ICD-10-CM | POA: Diagnosis present

## 2024-06-09 DIAGNOSIS — M5442 Lumbago with sciatica, left side: Secondary | ICD-10-CM | POA: Diagnosis not present

## 2024-06-09 DIAGNOSIS — M7138 Other bursal cyst, other site: Secondary | ICD-10-CM | POA: Insufficient documentation

## 2024-06-09 DIAGNOSIS — R252 Cramp and spasm: Secondary | ICD-10-CM | POA: Diagnosis present

## 2024-06-09 DIAGNOSIS — M25552 Pain in left hip: Secondary | ICD-10-CM | POA: Diagnosis present

## 2024-06-09 DIAGNOSIS — M6281 Muscle weakness (generalized): Secondary | ICD-10-CM | POA: Diagnosis present

## 2024-06-10 ENCOUNTER — Encounter: Payer: Self-pay | Admitting: Neurosurgery

## 2024-06-10 ENCOUNTER — Ambulatory Visit: Admitting: Neurosurgery

## 2024-06-10 VITALS — BP 124/72 | Ht 64.0 in | Wt 134.0 lb

## 2024-06-10 DIAGNOSIS — M7138 Other bursal cyst, other site: Secondary | ICD-10-CM | POA: Diagnosis not present

## 2024-06-10 DIAGNOSIS — G8929 Other chronic pain: Secondary | ICD-10-CM

## 2024-06-10 DIAGNOSIS — M532X6 Spinal instabilities, lumbar region: Secondary | ICD-10-CM | POA: Diagnosis not present

## 2024-06-10 DIAGNOSIS — M5442 Lumbago with sciatica, left side: Secondary | ICD-10-CM

## 2024-06-10 DIAGNOSIS — M4316 Spondylolisthesis, lumbar region: Secondary | ICD-10-CM

## 2024-06-10 NOTE — Progress Notes (Signed)
 Referring Physician:  No referring provider defined for this encounter.  Primary Physician:  Nestor Elston NOVAK, NP  History of Present Illness: 06/10/2024 She continues to have symptoms.  She just started physical therapy.  03/20/2024 [name] is here today with a chief complaint of back pain as well as left greater than right leg pain.  She has pain into her buttocks that goes down the back of her legs.  She feels off balance.  Her pain can be as bad as 10 out of 10 particularly with bending, walking, sitting, and standing.  Nothing really helps.  She has not recently done conservative management, but this is beginning to interfere with her life. Bowel/Bladder Dysfunction: none  Conservative measures:  Physical therapy:  has not participated in PT Multimodal medical therapy including regular antiinflammatories: Flexeril, Tylenol  Injections:  no epidural steroid injections  Past Surgery: no spinal surgeries   [name] has no symptoms of cervical myelopathy.  The symptoms are causing a significant impact on the patient's life.   I have utilized the care everywhere function in epic to review the outside records available from external health systems.  Review of Systems:  A 10 point review of systems is negative, except for the pertinent positives and negatives detailed in the HPI.  Past Medical History: Past Medical History:  Diagnosis Date   Anemia    Anxiety    w panic disorder   Chest pain    a. Felt noncardiac with normal stress echo 11/2012. per pt having episodes as recently as today 11/22/23, no cardiologist, follows with PCP D. Tetter @ [facility]   Cholelithiasis    a. on Abd US  11/2012.   Complication of anesthesia    Depression    Fibromyalgia    GERD (gastroesophageal reflux disease)    Hypertension    Migraines    PONV (postoperative nausea and vomiting)    W/ anesthesia gas   Radiculopathy of cervical region    Stroke [facility]) 11/13/2005   TIA,  no residual effects    Past Surgical History: Past Surgical History:  Procedure Laterality Date   CHOLECYSTECTOMY N/A 08/07/2013   Procedure: LAPAROSCOPIC CHOLECYSTECTOMY;  Surgeon: Deward GORMAN Curvin DOUGLAS, MD;  Location: [facility] OR;  Service: General;  Laterality: N/A;   CLEFT LIP REPAIR     Multiple   CLEFT PALATE REPAIR     Multiple   ROTATOR CUFF REPAIR  2010   left   TUBAL LIGATION  1998    Allergies: Allergies as of 06/10/2024 - Review Complete 06/10/2024  Allergen Reaction Noted   Other Nausea And Vomiting 08/01/2013   Latex Hives, Itching, and Rash 12/12/2012   Amoxicillin Nausea Only 12/11/2012   Contrast media [iodinated contrast media]  09/17/2018   Iohexol  10/05/2008   Doxycycline Rash 07/31/2013   Erythromycin Rash 12/11/2012    Medications:  Current Outpatient Medications:    aspirin  EC 81 MG tablet, Take 81 mg by mouth daily. Swallow whole., Disp: , Rfl:    atenolol (TENORMIN) 25 MG tablet, Take by mouth daily., Disp: , Rfl:    Cholecalciferol (VITAMIN D3) 1.25 MG (50000 UT) CAPS, Take 1 capsule by mouth once a week., Disp: , Rfl:    cyclobenzaprine (FLEXERIL) 10 MG tablet, Take 1 tablet by mouth 2 (two) times daily., Disp: , Rfl: 0   dicyclomine (BENTYL) 10 MG capsule, Take 10 mg by mouth 4 (four) times daily -  before meals and at bedtime. (Patient taking differently:  Take 10 mg by mouth as needed.), Disp: , Rfl:    docusate sodium (COLACE) 100 MG capsule, Take 200 mg by mouth 2 (two) times daily., Disp: , Rfl:    Fremanezumab -vfrm (AJOVY ) 225 MG/1.5ML SOAJ, Inject 225 mg into the skin every 28 (twenty-eight) days. (Patient taking differently: Inject 225 mg into the skin every 28 (twenty-eight) days. Pt currently on hold for this medication, trying to prescribe a new med), Disp: 1.68 mL, Rfl: 5   hydrOXYzine (VISTARIL) 25 MG capsule, Take 25 mg by mouth 2 (two) times daily., Disp: , Rfl:    nystatin cream (MYCOSTATIN), Apply 1 Application topically 2 (two) times daily.,  Disp: , Rfl:    omeprazole  (PRILOSEC) 40 MG capsule, Take 40 mg by mouth daily., Disp: , Rfl:    oxybutynin (DITROPAN) 5 MG tablet, Take 5 mg by mouth 3 (three) times daily as needed for bladder spasms., Disp: , Rfl:    polyethylene glycol powder (GLYCOLAX/MIRALAX) 17 GM/SCOOP powder, Take 34 g by mouth daily., Disp: , Rfl:    POTASSIUM CITRATE PO, Take 1,000 mg by mouth once., Disp: , Rfl:    Rimegepant Sulfate (NURTEC) 75 MG TBDP, Take 1 tablet (75 mg total) by mouth daily as needed., Disp: 8 tablet, Rfl: 5   SUMAtriptan  (IMITREX ) 6 MG/0.5ML SOLN injection, Inject 0.5 mLs (6 mg total) into the skin as needed for migraine or headache. May repeat in 1 hour if headache persists or recurs. Maximum 2 injections in 24 hours., Disp: 5 mL, Rfl: 5  Social History: Social History   Tobacco Use   Smoking status: Former    Current packs/day: 0.00    Average packs/day: 0.1 packs/day for 1.3 years (0.1 ttl pk-yrs)    Types: Cigarettes    Start date: 04/10/2012    Quit date: 08/07/2013    Years since quitting: 10.8   Smokeless tobacco: Never  Vaping Use   Vaping status: Never Used  Substance Use Topics   Alcohol use: Yes    Comment: 1-2 a year   Drug use: No    Family Medical History: Family History  Problem Relation Age of Onset   Migraines Mother    Heart disease Father    Alcohol abuse Father    Heart attack Father    Migraines Maternal Aunt    Breast cancer Maternal Aunt    Depression Maternal Aunt    Migraines Maternal Aunt    Heart disease Paternal Uncle    Diabetes Paternal Uncle    Stroke Paternal Uncle    Ovarian cancer Maternal Grandmother    Heart disease Maternal Grandmother    Stroke Maternal Grandmother    Depression Maternal Grandmother    Heart attack Maternal Grandmother    Hypertension Maternal Grandmother    Heart disease Maternal Grandfather    Heart attack Maternal Grandfather    Heart disease Paternal Grandmother    Heart attack Paternal Grandmother     Diabetes Paternal Grandfather    Stroke Paternal Grandfather    Migraines Son    Migraines Son     Physical Examination: Vitals:   06/10/24 1411  BP: 124/72     General: Patient is in no apparent distress. Attention to examination is appropriate.  Neck:   Supple.  Full range of motion.  Respiratory: Patient is breathing without any difficulty.   NEUROLOGICAL:     Awake, alert, oriented to person, place, and time.  Speech is clear and fluent.   Cranial Nerves: CNI  Strength:  MAEW  Gait is normal.     Medical Decision Making  Imaging: MRI L spine 02/17/2024 IMPRESSION: 1. L4-5: Advanced bilateral facet arthropathy allowing anterolisthesis of 4 mm. Bulging of the disc. Synovial cyst projecting inward from the facet joint on the left measuring 3 x 5 mm. Stenosis of the lateral recesses and foramina, left worse than right. Some potential for neural compression, particularly on the left. The facet arthropathy would likely be painful. 2. L5-S1: Facet osteoarthritis with 3 mm of anterolisthesis. No stenosis of the canal or foramina. The facet arthritis could be painful, but does not show the edematous changes present at the L4-5 level.     Electronically Signed   By: Oneil Officer M.D.   On: 03/12/2024 08:12  Lumbar spine flexion-extension x-rays from Mar 20, 2024  FINDINGS: There is no evidence of lumbar spine fracture. Grade 1 anterolisthesis of L4 on L5, L5 on S1. Mild narrow intervertebral spaces with facet joint sclerosis at L4-5 and L5-S1. The degree of anterolisthesis at L4-5 measures 1.1 cm on extension view, 0.9 cm on flexion view, 1 cm on neutral view. The degree of anterolisthesis on L5-S1 measures 7 mm on neutral view, 7 mm on flexion view, 8 mm on extension view. Prior cholecystectomy clips   IMPRESSION: Degenerative joint changes of lower lumbar spine.     Electronically Signed   By: Craig Farr M.D.   On: 03/21/2024 10:50   I have  personally reviewed the images and agree with the above interpretation.  Assessment and Plan: [name] is a pleasant 53 y.o. 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  She also has spondylolisthesis at L5-S1.  She has back pain with left-sided sciatica.  She has implied instability as her anterolisthesis is worsened over time.  She will continue physical therapy for now.  I will see her back in a few weeks.  If she does not improve, we will consider L4-S1 transforaminal lumbar interbody fusion.  I spent a total of 10 minutes in this patient's care today. This time was spent reviewing pertinent records including imaging studies, obtaining and confirming history, performing a directed evaluation, formulating and discussing my recommendations, and documenting the visit within the medical record.     Thank you for involving me in the care of this patient.      Makya Phillis K. Clois MD, [facility] Neurosurgery

## 2024-06-19 ENCOUNTER — Telehealth: Payer: Self-pay | Admitting: Pharmacy Technician

## 2024-06-19 NOTE — Telephone Encounter (Signed)
 Dr. Skeet / Jesusa,  Vypeti has been denied due to patient has not failed or tried all 3 preferred medications. Aimovig  Ajovy  Emgality   Despite clinicals stating the patient has failed Ajovy  and Emgality ;  Aimovig  is not an option due to refractory HTN. Despite a courtesy review and an appeal letter it still has been denied. Please call and schedule a 2nd appeal. The denial letter has been scanned to the media tab along with the appeal letter that was sent for review.  Thanks Luke

## 2024-06-20 MED ORDER — AIMOVIG 140 MG/ML ~~LOC~~ SOAJ: 140.0000 mg | SUBCUTANEOUS | 11 refills | Status: AC

## 2024-06-20 NOTE — Telephone Encounter (Signed)
 Patient advised of DR.Jaffe note, We can start the Aimovig  but she would need to monitor her blood pressure closely. Otherwise, we can increase atenolol to 50mg  daily.    Aimovig  called into [facility].

## 2024-06-20 NOTE — Addendum Note (Signed)
 Addended by: OZELL JESUSA PARAS on: 06/20/2024 04:03 PM   Modules accepted: Orders

## 2024-06-24 ENCOUNTER — Ambulatory Visit: Admitting: Physical Therapy

## 2024-06-27 ENCOUNTER — Telehealth: Payer: Self-pay

## 2024-06-27 NOTE — Telephone Encounter (Signed)
*  [facility]  Pharmacy Patient Advocate Encounter   Received notification from Fax that prior authorization for Aimovig  140MG /ML auto-injectors   is required/requested.   Insurance verification completed.   The patient is insured through [employer] .   Per test claim: PA required; PA submitted to above mentioned insurance via Latent Key/confirmation #/EOC [id number] Status is pending

## 2024-06-30 ENCOUNTER — Other Ambulatory Visit (HOSPITAL_COMMUNITY): Payer: Self-pay

## 2024-06-30 ENCOUNTER — Ambulatory Visit: Payer: BC Managed Care – PPO | Admitting: Neurology

## 2024-06-30 NOTE — Telephone Encounter (Signed)
 Pharmacy Patient Advocate Encounter  Received notification from [facility] that Prior Authorization for Aimovig  140MG /ML auto-injectors   has been APPROVED from 06/27/2024 to 09/19/2024. Unable to obtain price due to refill too soon rejection, last fill date 08/16 next available fill date09/06

## 2024-07-30 ENCOUNTER — Telehealth: Payer: Self-pay | Admitting: Neurosurgery

## 2024-07-30 NOTE — Telephone Encounter (Signed)
 Patient was calling to move her appointment up sooner due to pain levels.  She is now starting to have pain in her upper back/neck area. She would like to know if Dr.Yarbrough could refer her to the pain clinic for injections or if she should wait to be reevaluated at her upcoming appt? Please advise.

## 2024-08-01 NOTE — Telephone Encounter (Addendum)
 I spoke to patient and she states that since she has an appointment on Thursday she will hold off on the referral. She has been taking Tylenol  and Flexeril but they are not helping much. Most of the pain medications cause extreme itching so she tries not to take them if possible.  She does not need a refill of the Flexeril she has another provider that prescribes it for her in [facility].

## 2024-08-05 ENCOUNTER — Other Ambulatory Visit: Payer: Self-pay | Admitting: Neurology

## 2024-08-07 ENCOUNTER — Ambulatory Visit: Admitting: Neurosurgery

## 2024-08-12 ENCOUNTER — Ambulatory Visit: Admitting: Neurosurgery

## 2024-10-10 ENCOUNTER — Other Ambulatory Visit: Payer: Self-pay | Admitting: Neurology

## 2025-01-05 ENCOUNTER — Ambulatory Visit: Admitting: Neurology
